# Patient Record
Sex: Male | Born: 1970 | Race: White | Hispanic: No | Marital: Married | Smoking: Never smoker
Health system: Southern US, Community
[De-identification: ages and names within clinical notes are randomized; demographics above are authoritative.]

## PROBLEM LIST (undated history)

## (undated) DIAGNOSIS — I10 Essential (primary) hypertension: Secondary | ICD-10-CM

## (undated) DIAGNOSIS — R51 Headache: Secondary | ICD-10-CM

## (undated) DIAGNOSIS — M1990 Unspecified osteoarthritis, unspecified site: Secondary | ICD-10-CM

## (undated) DIAGNOSIS — R519 Headache, unspecified: Secondary | ICD-10-CM

## (undated) DIAGNOSIS — K219 Gastro-esophageal reflux disease without esophagitis: Secondary | ICD-10-CM

## (undated) DIAGNOSIS — S199XXA Unspecified injury of neck, initial encounter: Secondary | ICD-10-CM

## (undated) DIAGNOSIS — J40 Bronchitis, not specified as acute or chronic: Secondary | ICD-10-CM

## (undated) HISTORY — PX: CERVICAL SPINE SURGERY: SHX589

## (undated) HISTORY — PX: OTHER SURGICAL HISTORY: SHX169

## (undated) HISTORY — PX: WISDOM TOOTH EXTRACTION: SHX21

## (undated) HISTORY — PX: ELBOW SURGERY: SHX618

---

## 2007-03-23 ENCOUNTER — Ambulatory Visit: Payer: Self-pay | Admitting: Orthopedic Surgery

## 2007-04-29 ENCOUNTER — Inpatient Hospital Stay (HOSPITAL_COMMUNITY): Admission: RE | Admit: 2007-04-29 | Discharge: 2007-04-30 | Payer: Self-pay | Admitting: Neurosurgery

## 2007-12-19 ENCOUNTER — Encounter: Admission: RE | Admit: 2007-12-19 | Discharge: 2007-12-19 | Payer: Self-pay | Admitting: Neurosurgery

## 2008-02-24 ENCOUNTER — Ambulatory Visit (HOSPITAL_COMMUNITY): Admission: RE | Admit: 2008-02-24 | Discharge: 2008-02-25 | Payer: Self-pay | Admitting: Neurosurgery

## 2008-03-03 ENCOUNTER — Emergency Department (HOSPITAL_COMMUNITY): Admission: EM | Admit: 2008-03-03 | Discharge: 2008-03-03 | Payer: Self-pay | Admitting: Emergency Medicine

## 2008-05-11 ENCOUNTER — Ambulatory Visit: Payer: Self-pay | Admitting: Neurosurgery

## 2008-08-29 ENCOUNTER — Ambulatory Visit: Payer: Self-pay | Admitting: Neurosurgery

## 2008-12-27 ENCOUNTER — Ambulatory Visit: Payer: Self-pay | Admitting: Neurosurgery

## 2009-01-03 ENCOUNTER — Ambulatory Visit (HOSPITAL_COMMUNITY): Admission: RE | Admit: 2009-01-03 | Discharge: 2009-01-03 | Payer: Self-pay | Admitting: Neurosurgery

## 2009-08-04 ENCOUNTER — Ambulatory Visit: Payer: Self-pay | Admitting: Family Medicine

## 2009-12-28 ENCOUNTER — Ambulatory Visit: Payer: Self-pay | Admitting: Physician Assistant

## 2010-08-17 ENCOUNTER — Ambulatory Visit: Payer: Self-pay

## 2011-01-01 NOTE — Op Note (Signed)
NAME:  Javier Griffin, Javier Griffin               ACCOUNT NO.:  [id number]   MEDICAL RECORD NO.:  [id number]          PATIENT TYPE:  INP   LOCATION:  3035                         FACILITY:  [facility]   PHYSICIAN:  [name], M.D.DATE OF BIRTH:  1971-05-09   DATE OF PROCEDURE:  DATE OF DISCHARGE:                               OPERATIVE REPORT   BRIEF HISTORY:  The patient is a 40 year old white male who suffered  from neck and right arm pain.  He failed medical management and worked  up with a cervical MRI, which demonstrated he had a large herniated disc  at C5-C6 with significant spondylosis at C6-C7.  I discussed various  treatments options with the patient including surgery.  The patient is  aware of the risks, benefits, and alternatives of the surgery and have  decided to proceed with C5-C6 and C6-C7 anterior cervical discectomy and  fusion with plate.   PREOPERATIVE DIAGNOSES:  C5-C6 herniated nucleus pulposus, C6-C7  spondylosis, cervical stenosis, cervical radiculopathy, and cervicalgia.   POSTOPERATIVE DIAGNOSES:  C5-C6 herniated nucleus pulposus, C6-C7  spondylosis, cervical stenosis, cervical radiculopathy, and cervicalgia.   PROCEDURE:  C5-C6 and C6-C7 extensive anterior cervical  discectomy/decompression.  Insertion of a C5-C6 and C6-C7 interbody  prosthesis (Alphatec PEEK interbody prosthesis); C5-6 and C6-C7 anterior  interbody arthrodesis with local morselized autograft bone; C5-C7  anterior cervical plating with Codman Slim-Loc titanium plate and  screws.   SURGEON:  [name], M.D.   ASSISTANT:  Danae Orleans. Venetia Maxon, M.D.   ANESTHESIA:  General endotracheal.   ESTIMATED BLOOD LOSS:  100 mL.   SPECIMENS:  None.   DRAINS:  None.   COMPLICATIONS:  None.   PROCEDURE:  The patient was brought to the operating room by anesthesia  team.  General endotracheal anesthesia was induced.  The patient  remained in supine position.  A roll was placed under shoulders to  place  his neck in a slight extension.  His anterior cervical region was then  prepared with Betadine scrub and Betadine solution.  Sterile drapes were  applied.  I then injected the area to incise with Marcaine with  epinephrine solution.  I used a scalpel to make a transverse incision in  the patient's left anterior neck.  I used the Metzenbaum scissors to  divide the platysma muscle and then to dissect medial to  sternocleidomastoid muscle, jugular vein, and carotid artery.  I  carefully dissected down towards the anterior cervical spine and  identified the esophagus retracting it medially.  I then used kitner  swabs to clear soft tissue from the anterior cervical spine and inserted  a bent spinal needle into the upper exposed intervertebral disc space.  We obtained an intraoperative radiograph to confirm our location.   I then used electrocautery to detach the medial border of the longus  colli muscle bilaterally from C5-6 and C6-7 intervertebral disc space.  We then inserted the Caspar self-retaining retractor for exposure.  We  began at C6-C7, I incised the C6-7 intervertebral disc with a 15 blade  scalpel.  We performed a partial intervertebral discectomy with the  pituitary forceps and the Carlens curettes.  We then inserted  distraction screws at C6 and C7, then distracted interspace and then  used a high-speed drill to decorticate the vertebral endplates at C6-C7,  drill away the remainder of C6-C7 intervertebral disc, drill away some  posterior spondylosis, and then to thin out the posterior longitudinal  ligament.  We then incised the ligament with arachnoid knife and removed  it with Kerrison punch undercutting the vertebral endplates  decompressing the thecal sac.  We then performed a foraminotomy about  the bilateral C7 nerve root completing the decompression at this level.  At C6-C7, we found mainly spondylosis.   We then repeated this procedure in an analagous fashion  at C5-6  decompressing the thecal sac and bilateral C6 nerve root.  Of note, we  encountered a large right-sided herniated disc compressing the right C6  nerve root.   Having completed the decompression at C5-C6 and C6-C7, we now turned our  attention to arthrodesis.  We used trial spacers and determined to use a  5-mm Alphatec medium PEAK interbody prosthesis.  We prefilled this  prosthesis with a combination of local autograft bone we obtained during  the decompression, as well as detached bone graft extender.  We then  inserted the prosthesis to the distracted interspace and then removed  the distraction screws.  There was a good snug fit of the prosthesis at  both levels.   We now turned our attention to the anterior spinal instrumentation.  We  used a high-speed drill to drill away some ventral spondylosis from the  vertebral endplates at C5-6 and C6-C7, so that the plate would lay down  flat.  We selected appropriate length Codman Slim-Loc anterior cervical  plate and laid it along the anterior aspect of the vertebral bodies of  C5 to C7.  We then drilled two 14-mm holes at C5-C6 and C6-C7, and  secured the plate to the vertebral bodies by placing two 14-mm self-  tapping screws at C5-C6 and C6-C7.  We then obtained intraoperative  radiograph.  There was limited visualization of lower plate screws, but  it all looked good in vivo.  We therefore secured the screws and plate  by locking each cam completing the instrumentation.   We then obtained hemostasis using bipolar cautery.  We irrigated the  wound out with bacitracin solution.  We then removed the Caspar  retractor.  We then inspected the esophagus for any damage, there was  none apparent.  We then reapproximated the patient's platysma muscle  with interrupted 3-0 Vicryl sutures, subcutaneous tissue with  interrupted 3-0 Vicryl suture, and skin with Steri-Strips and Benzoin.  The wound was then coated with bacitracin  ointment.  A sterile dressing  applied.  The drapes were removed.  The patient was subsequently  extubated by the anesthesia team and transported to post anesthesia care  unit in stable condition.  All sponge, instrument, and needle counts  were correct at the end of this case.      [name], M.D.  Electronically Signed     JDJ/MEDQ  D:  04/29/2007  T:  04/30/2007  Job:  [id number]

## 2011-01-01 NOTE — Op Note (Signed)
NAME:  Marines, Leith               ACCOUNT NO.:  [id number]   MEDICAL RECORD NO.:  [id number]          PATIENT TYPE:  OIB   LOCATION:  3533                         FACILITY:  [facility]   PHYSICIAN:  [name], M.D.DATE OF BIRTH:  1970/08/28   DATE OF PROCEDURE:  02/24/2008  DATE OF DISCHARGE:                               OPERATIVE REPORT   BRIEF HISTORY:  The patient is a 40 year old white male for whom I  performed a C5-L6 and C6-C7 anterior cervical discectomy and fusion and  plating back in September 2008.  The patient then initially did well but  then developed neck and left shoulder/arm pain.  He failed medical  management and was worked up with a cervical MRI which demonstrated  herniated disk at C4-C5 on the left.  I discussed various treatment  options with him, and the patient decided to proceed with a C4-C5  diskectomy, arthroplasty, and exploration of his fusion at C5-C6 and C6-  C7.   PREOPERATIVE DIAGNOSES:  C4-C5 herniated nucleus pulposus, spinal  stenosis, cervical radiculopathy/myelopathy, cervicalgia.   POSTOPERATIVE DIAGNOSES:  C4-C5 herniated nucleus pulposus, spinal  stenosis, cervical radiculopathy/myelopathy, cervicalgia.   PROCEDURES:  1. C4-C5 extensive anterior cervical diskectomy/decompression.  2. C4-C5 arthroplasty with Synthes ProDisc.  3. Exploration of C5-C7 cervical fusion/instrumentation.  4. C5-C7 anterior instrumentation with Codman Slim-Loc titanium plate      and screws.   SURGEON:  [name], MD   ASSISTANT:  None.   ANESTHESIA:  General endotracheal.   ESTIMATED BLOOD LOSS:  100 mL.   SPECIMENS:  None.   DRAINS:  None.   COMPLICATIONS:  None.   DESCRIPTION OF PROCEDURE:  The patient was brought to the operating room  by the anesthesia team.  General endotracheal anesthesia was induced.  The patient remained in supine position, placed a blanket under the  shoulder, also placed his neck in slight extension.  His  anterior  cervical region was then prepared with Betadine scrub and Betadine  solution.  Sterile drapes were applied and then injected the area to be  incised with Marcaine with epinephrine solution.  I used a scalpel to  make a transverse incision in the patient's left anterior neck.  I used  Metzenbaum scissors to divide the platysma muscle and then to dissect  medial to sternocleidomastoid muscle, jugular vein, carotid artery,  carefully dissected through the previous scar tissue identifying the  esophagus and retracting it medially.  We used skin swabs to clear soft  tissue from the anterior cervical spine.  We encountered the typical  layer of scar tissue over old plate.  We incised the scar tissue over  the old plate and then carefully dissected free from the underlying  plate.  This exposed the underlying plate.  We then unlocked the cams,  removed the screws, and removed the old plate.  We then explored the  arthrodesis at C5-C6 at C6-C7.  We could not see any clear motion at  these levels, but the inferior screws seemed somewhat loose, and I  therefore thought it prudent to replace the plate at the end  of the  case, in case there was pseudoarthrosis,we need to give him a long time  to heal.   We then used electrocautery to detach more medial border of the longus  colli muscle bilaterally from C4-C5 intervertebral disk space.  We  inserted a Caspar self-retaining retractor underneath the longus colli  muscle bilaterally to provide exposure.  We then incised the C4-C5  intervertebral disk with a 15 blade scalpel and performed a partial  intervertebral dissection with pituitary forceps.  We then inserted the  Synthes distraction pins under fluoroscopic guidance, distracted the  interspace of C4-C5.  We then brought the operative microscope into the  field and under magnification and illumination, we completed the  microdissection/decompression.  We used the high-speed drill to   decorticate the posterior vertebral endplates at C4-C5, drill away the  uncovertebral joints and to thin out the posterior longitudinal  ligament.  We incised the ligament with the arachnoid knife and then  removed it with Kerrison punch undercutting the vertebral endplates  decompressing the thecal sac.  We then performed foraminotomies about  the bilateral C5 nerve roots completing the decompression.   Now, we turned our attention to the arthroplasty.  We used trial spacers  and determined to use a 6-mm medium prosthesis.  We then used the drill  to create the troughs.  We then removed the drill guide and used a  chisel to deepen the troughs.  This was all done under fluoroscopic  guidance.  We then inserted the medium 6-mm Synthes ProDisc into the  interspace under fluoroscopic guidance.  We tapped it into place  appropriately.  We then removed the distraction screws.  We then put  bone wax over the anterior keels and over the distraction pin insertion  sites.  We obtained AP and lateral fluoroscopy images which demonstrated  good position of the prosthesis.   We now turned our attention to instrumentation of C5-C7.  Because the  lower screws were somewhat loose, I decided to put rescue screws in.  We  used the patient's old anterior plate and then secured it into place  using the old holes with a 14-mm rescue screws.  There was good purchase  of the screws at each level.  We then obtained a lateral x-ray which  demonstrated good position of the prosthesis and anterior plate and  screws.  We could not see the lower plate screws because of the  patient's shoulders, but they looked good in vivo.  We therefore secured  the screws to the plate by locking each cam.  This completed the  instrumentation.   We then obtained hemostasis using bipolar electrocautery.  We irrigated  the wound out with bacitracin solution.  We removed the retractor.  We  then inspected the esophagus for any  damage, there was none apparent.  We then reapproximated the patient's platysma muscle with interrupted 3-  0 Vicryl suture, subcutaneous tissue with interrupted 3-0 Vicryl suture,  and the skin with Steri-Strips and Benzoin.  The wound was then coated  with bacitracin ointment.  Sterile dressing was applied.  The drapes  were removed, and the patient was subsequently extubated by the  Anesthesia Team and transported to the Postanesthesia Care Unit in  stable condition.  All sponge, instrument, and needle counts were  correct at the end of this case.      [name], M.D.  Electronically Signed     JDJ/MEDQ  D:  02/24/2008  T:  02/25/2008  Job:  [id number]

## 2011-05-16 LAB — CBC
HCT: 43.6
Hemoglobin: 15.3
MCHC: 35.1
MCV: 88.6
RDW: 12.1

## 2011-05-31 LAB — CBC
HCT: 41.4
Platelets: 233
RDW: 11.9

## 2011-05-31 LAB — BASIC METABOLIC PANEL
BUN: 13
Calcium: 10
Creatinine, Ser: 0.71
GFR calc non Af Amer: >60
Glucose, Bld: 93

## 2012-04-06 ENCOUNTER — Ambulatory Visit: Payer: Self-pay | Admitting: Family Medicine

## 2012-04-08 ENCOUNTER — Ambulatory Visit: Payer: Self-pay | Admitting: Family Medicine

## 2012-12-24 ENCOUNTER — Ambulatory Visit: Payer: Self-pay | Admitting: Family Medicine

## 2012-12-28 ENCOUNTER — Ambulatory Visit: Payer: Self-pay | Admitting: Family Medicine

## 2012-12-30 ENCOUNTER — Ambulatory Visit: Payer: Self-pay | Admitting: Family Medicine

## 2013-03-19 ENCOUNTER — Other Ambulatory Visit: Payer: Self-pay | Admitting: Neurosurgery

## 2013-03-19 DIAGNOSIS — M542 Cervicalgia: Secondary | ICD-10-CM

## 2013-03-22 ENCOUNTER — Ambulatory Visit
Admission: RE | Admit: 2013-03-22 | Discharge: 2013-03-22 | Disposition: A | Discharge status: Home / Self Care | Payer: Self-pay | Source: Ambulatory Visit | Attending: Neurosurgery | Admitting: Neurosurgery

## 2013-03-22 VITALS — BP 145/94 | HR 64

## 2013-03-22 DIAGNOSIS — M542 Cervicalgia: Secondary | ICD-10-CM

## 2013-03-22 MED ORDER — HYDROMORPHONE HCL PF 2 MG/ML IJ SOLN
2.0000 mg | Freq: Once | INTRAMUSCULAR | Status: AC
  Administered 2013-03-22: 2 mg via INTRAMUSCULAR

## 2013-03-22 MED ORDER — DIAZEPAM 5 MG PO TABS
10.0000 mg | ORAL_TABLET | Freq: Once | ORAL | Status: AC
  Administered 2013-03-22: 10 mg via ORAL

## 2013-03-22 MED ORDER — ONDANSETRON HCL 4 MG/2ML IJ SOLN
4.0000 mg | Freq: Once | INTRAMUSCULAR | Status: AC
  Administered 2013-03-22: 4 mg via INTRAMUSCULAR

## 2013-03-22 MED ORDER — HYDROMORPHONE HCL 4 MG PO TABS
4.0000 mg | ORAL_TABLET | Freq: Once | ORAL | Status: AC
  Administered 2013-03-22: 4 mg via ORAL

## 2013-03-22 MED ORDER — HYDROMORPHONE HCL PF 1 MG/ML IJ SOLN
1.0000 mg | Freq: Once | INTRAMUSCULAR | Status: AC
  Administered 2013-03-22: 1 mg via INTRAMUSCULAR

## 2013-03-22 MED ORDER — IOHEXOL 300 MG/ML  SOLN
10.0000 mL | Freq: Once | INTRAMUSCULAR | Status: AC | PRN
  Administered 2013-03-22: 10 mL via INTRATHECAL

## 2013-04-12 ENCOUNTER — Other Ambulatory Visit: Payer: Self-pay | Admitting: Neurosurgery

## 2013-04-13 ENCOUNTER — Encounter (HOSPITAL_COMMUNITY): Payer: Self-pay | Admitting: Pharmacy Technician

## 2013-04-17 NOTE — Pre-Procedure Instructions (Signed)
Javier Griffin  04/17/2013   Your procedure is scheduled on:  September 4  Report to [facility] at 07:00 AM.  Call this number if you have problems the morning of surgery: [phone]   Remember:   Do not eat food or drink liquids after midnight.   Take these medicines the morning of surgery with A SIP OF WATER: Diazepam, Oxycodone (take these meds only if needed)   Do not take Aspirin, Aleve, Naproxen, Advil, Ibuprofen, Vitamin, Herbs, or Supplements starting today  Do not wear jewelry, make-up or nail polish.  Do not wear lotions, powders, or perfumes. You may wear deodorant.  Do not shave 48 hours prior to surgery. Men may shave face and neck.  Do not bring valuables to the hospital.  [facility] is not responsible                   for any belongings or valuables.  Contacts, dentures or bridgework may not be worn into surgery.  Leave suitcase in the car. After surgery it may be brought to your room.  For patients admitted to the hospital, checkout time is 11:00 AM the day of  discharge.   Special Instructions: Shower using CHG 2 nights before surgery and the night before surgery.  If you shower the day of surgery use CHG.  Use special wash - you have one bottle of CHG for all showers.  You should use approximately 1/3 of the bottle for each shower.   Please read over the following fact sheets that you were given: Pain Booklet, Coughing and Deep Breathing, Blood Transfusion Information and Surgical Site Infection Prevention

## 2013-04-20 ENCOUNTER — Encounter (HOSPITAL_COMMUNITY)
Admission: RE | Admit: 2013-04-20 | Discharge: 2013-04-20 | Disposition: A | Discharge status: Home / Self Care | Payer: Worker's Compensation | Source: Ambulatory Visit | Attending: Neurosurgery | Admitting: Neurosurgery

## 2013-04-20 ENCOUNTER — Encounter (HOSPITAL_COMMUNITY): Payer: Self-pay

## 2013-04-20 LAB — BASIC METABOLIC PANEL
BUN: 15 mg/dL (ref 6–23)
CO2: 27 mEq/L (ref 19–32)
Glucose, Bld: 93 mg/dL (ref 70–99)
Potassium: 4 mEq/L (ref 3.5–5.1)
Sodium: 135 mEq/L (ref 135–145)

## 2013-04-20 LAB — SURGICAL PCR SCREEN
MRSA, PCR: NEGATIVE
Staphylococcus aureus: POSITIVE — AB

## 2013-04-20 LAB — CBC
HCT: 40.1 % (ref 39.0–52.0)
Hemoglobin: 14.3 g/dL (ref 13.0–17.0)
MCH: 30.6 pg (ref 26.0–34.0)
MCHC: 35.7 g/dL (ref 30.0–36.0)
RBC: 4.67 MIL/uL (ref 4.22–5.81)

## 2013-04-20 LAB — TYPE AND SCREEN: Antibody Screen: NEGATIVE

## 2013-04-20 NOTE — Progress Notes (Signed)
Javier Griffin notified to call in prescription for mupirocin to [facility] on [address]

## 2013-04-20 NOTE — Progress Notes (Signed)
Primary physician - Isabel Caprice - [facility] Does not have a cardiology  No prior cardiac testing

## 2013-04-21 MED ORDER — CEFAZOLIN SODIUM-DEXTROSE 2-3 GM-% IV SOLR
2.0000 g | INTRAVENOUS | Status: AC
  Administered 2013-04-22: 2 g via INTRAVENOUS
  Filled 2013-04-21: qty 50

## 2013-04-22 ENCOUNTER — Encounter (HOSPITAL_COMMUNITY)
Admission: RE | Disposition: A | Discharge status: Home / Self Care | Payer: Self-pay | Source: Ambulatory Visit | Attending: Neurosurgery

## 2013-04-22 ENCOUNTER — Encounter (HOSPITAL_COMMUNITY): Payer: Self-pay | Admitting: Critical Care Medicine

## 2013-04-22 ENCOUNTER — Ambulatory Visit (HOSPITAL_COMMUNITY)
Admission: RE | Admit: 2013-04-22 | Discharge: 2013-04-23 | Disposition: A | Discharge status: Home / Self Care | Payer: Worker's Compensation | Source: Ambulatory Visit | Attending: Neurosurgery | Admitting: Neurosurgery

## 2013-04-22 ENCOUNTER — Encounter (HOSPITAL_COMMUNITY): Payer: Self-pay | Admitting: Surgery

## 2013-04-22 ENCOUNTER — Ambulatory Visit (HOSPITAL_COMMUNITY): Payer: Worker's Compensation

## 2013-04-22 ENCOUNTER — Ambulatory Visit (HOSPITAL_COMMUNITY): Payer: Worker's Compensation | Admitting: Critical Care Medicine

## 2013-04-22 DIAGNOSIS — M5090 Cervical disc disorder, unspecified, unspecified cervical region: Secondary | ICD-10-CM | POA: Insufficient documentation

## 2013-04-22 DIAGNOSIS — M5000 Cervical disc disorder with myelopathy, unspecified cervical region: Secondary | ICD-10-CM

## 2013-04-22 DIAGNOSIS — M5020 Other cervical disc displacement, unspecified cervical region: Secondary | ICD-10-CM | POA: Insufficient documentation

## 2013-04-22 HISTORY — PX: ANTERIOR CERVICAL DECOMP/DISCECTOMY FUSION: SHX1161

## 2013-04-22 SURGERY — ANTERIOR CERVICAL DECOMPRESSION/DISCECTOMY FUSION 1 LEVEL/HARDWARE REMOVAL
Anesthesia: General | Site: Spine Cervical | Wound class: Clean

## 2013-04-22 MED ORDER — OXYCODONE-ACETAMINOPHEN 5-325 MG PO TABS
1.0000 | ORAL_TABLET | ORAL | Status: DC | PRN
  Filled 2013-04-22: qty 2

## 2013-04-22 MED ORDER — HEMOSTATIC AGENTS (NO CHARGE) OPTIME
TOPICAL | Status: DC | PRN
  Administered 2013-04-22: 1 via TOPICAL

## 2013-04-22 MED ORDER — PANTOPRAZOLE SODIUM 40 MG IV SOLR
40.0000 mg | Freq: Every day | INTRAVENOUS | Status: DC
  Administered 2013-04-22: 40 mg via INTRAVENOUS
  Filled 2013-04-22 (×2): qty 40

## 2013-04-22 MED ORDER — 0.9 % SODIUM CHLORIDE (POUR BTL) OPTIME
TOPICAL | Status: DC | PRN
  Administered 2013-04-22: 1000 mL

## 2013-04-22 MED ORDER — PHENYLEPHRINE HCL 10 MG/ML IJ SOLN
INTRAMUSCULAR | Status: DC | PRN
  Administered 2013-04-22: 40 ug via INTRAVENOUS
  Administered 2013-04-22: 80 ug via INTRAVENOUS

## 2013-04-22 MED ORDER — THROMBIN 5000 UNITS EX SOLR
CUTANEOUS | Status: DC | PRN
  Administered 2013-04-22 (×2): 5000 [IU] via TOPICAL

## 2013-04-22 MED ORDER — OXYCODONE HCL 5 MG/5ML PO SOLN: 5.0000 mg | Freq: Once | ORAL | Status: DC | PRN

## 2013-04-22 MED ORDER — LIDOCAINE HCL (CARDIAC) 20 MG/ML IV SOLN
INTRAVENOUS | Status: DC | PRN
  Administered 2013-04-22: 100 mg via INTRAVENOUS

## 2013-04-22 MED ORDER — DOCUSATE SODIUM 100 MG PO CAPS
100.0000 mg | ORAL_CAPSULE | Freq: Two times a day (BID) | ORAL | Status: DC
  Administered 2013-04-22 – 2013-04-23 (×3): 100 mg via ORAL
  Filled 2013-04-22 (×3): qty 1

## 2013-04-22 MED ORDER — ARTIFICIAL TEARS OP OINT
TOPICAL_OINTMENT | OPHTHALMIC | Status: DC | PRN
  Administered 2013-04-22: 1 via OPHTHALMIC

## 2013-04-22 MED ORDER — PHENOL 1.4 % MT LIQD: 1.0000 | OROMUCOSAL | Status: DC | PRN

## 2013-04-22 MED ORDER — DIAZEPAM 5 MG PO TABS
5.0000 mg | ORAL_TABLET | Freq: Four times a day (QID) | ORAL | Status: DC | PRN
  Administered 2013-04-22 – 2013-04-23 (×2): 5 mg via ORAL
  Filled 2013-04-22 (×3): qty 1

## 2013-04-22 MED ORDER — MEPERIDINE HCL 25 MG/ML IJ SOLN: 6.2500 mg | INTRAMUSCULAR | Status: DC | PRN

## 2013-04-22 MED ORDER — HYDROMORPHONE HCL PF 1 MG/ML IJ SOLN
INTRAMUSCULAR | Status: AC
  Filled 2013-04-22: qty 1

## 2013-04-22 MED ORDER — MUPIROCIN 2 % EX OINT: 1.0000 "application " | TOPICAL_OINTMENT | Freq: Two times a day (BID) | CUTANEOUS | Status: DC

## 2013-04-22 MED ORDER — PROPOFOL 10 MG/ML IV BOLUS
INTRAVENOUS | Status: DC | PRN
  Administered 2013-04-22: 40 mg via INTRAVENOUS
  Administered 2013-04-22: 100 mg via INTRAVENOUS

## 2013-04-22 MED ORDER — LACTATED RINGERS IV SOLN: INTRAVENOUS | Status: DC

## 2013-04-22 MED ORDER — LACTATED RINGERS IV SOLN
INTRAVENOUS | Status: DC
  Administered 2013-04-22 (×2): via INTRAVENOUS

## 2013-04-22 MED ORDER — HYDROCODONE-ACETAMINOPHEN 5-325 MG PO TABS: 1.0000 | ORAL_TABLET | ORAL | Status: DC | PRN

## 2013-04-22 MED ORDER — DEXAMETHASONE SODIUM PHOSPHATE 4 MG/ML IJ SOLN
4.0000 mg | Freq: Four times a day (QID) | INTRAMUSCULAR | Status: AC
  Administered 2013-04-22: 4 mg via INTRAVENOUS
  Filled 2013-04-22: qty 1

## 2013-04-22 MED ORDER — CEFAZOLIN SODIUM-DEXTROSE 2-3 GM-% IV SOLR
2.0000 g | Freq: Three times a day (TID) | INTRAVENOUS | Status: AC
Start: 2013-04-22 — End: 2013-04-23
  Administered 2013-04-22 (×2): 2 g via INTRAVENOUS
  Filled 2013-04-22 (×2): qty 50

## 2013-04-22 MED ORDER — BACITRACIN ZINC 500 UNIT/GM EX OINT
TOPICAL_OINTMENT | CUTANEOUS | Status: DC | PRN
  Administered 2013-04-22: 1 via TOPICAL

## 2013-04-22 MED ORDER — ZOLPIDEM TARTRATE 5 MG PO TABS: 5.0000 mg | ORAL_TABLET | Freq: Every evening | ORAL | Status: DC | PRN

## 2013-04-22 MED ORDER — ONDANSETRON HCL 4 MG/2ML IJ SOLN
INTRAMUSCULAR | Status: DC | PRN
  Administered 2013-04-22: 4 mg via INTRAVENOUS

## 2013-04-22 MED ORDER — ROCURONIUM BROMIDE 100 MG/10ML IV SOLN
INTRAVENOUS | Status: DC | PRN
  Administered 2013-04-22 (×2): 10 mg via INTRAVENOUS
  Administered 2013-04-22: 50 mg via INTRAVENOUS

## 2013-04-22 MED ORDER — ONDANSETRON HCL 4 MG/2ML IJ SOLN: 4.0000 mg | INTRAMUSCULAR | Status: DC | PRN

## 2013-04-22 MED ORDER — ONDANSETRON HCL 4 MG/2ML IJ SOLN: 4.0000 mg | Freq: Once | INTRAMUSCULAR | Status: DC | PRN

## 2013-04-22 MED ORDER — ALUM & MAG HYDROXIDE-SIMETH 200-200-20 MG/5ML PO SUSP: 30.0000 mL | Freq: Four times a day (QID) | ORAL | Status: DC | PRN

## 2013-04-22 MED ORDER — DEXAMETHASONE SODIUM PHOSPHATE 4 MG/ML IJ SOLN
INTRAMUSCULAR | Status: DC | PRN
  Administered 2013-04-22: 10 mg via INTRAVENOUS

## 2013-04-22 MED ORDER — MENTHOL 3 MG MT LOZG
1.0000 | LOZENGE | OROMUCOSAL | Status: DC | PRN
  Filled 2013-04-22: qty 9

## 2013-04-22 MED ORDER — MIDAZOLAM HCL 5 MG/5ML IJ SOLN
INTRAMUSCULAR | Status: DC | PRN
  Administered 2013-04-22: 2 mg via INTRAVENOUS

## 2013-04-22 MED ORDER — MUPIROCIN 2 % EX OINT
TOPICAL_OINTMENT | Freq: Two times a day (BID) | CUTANEOUS | Status: DC
  Administered 2013-04-22: 1 via NASAL
  Administered 2013-04-22 – 2013-04-23 (×2): via NASAL
  Filled 2013-04-22: qty 22

## 2013-04-22 MED ORDER — BUPIVACAINE-EPINEPHRINE 0.5% -1:200000 IJ SOLN
INTRAMUSCULAR | Status: DC | PRN
  Administered 2013-04-22: 10 mL

## 2013-04-22 MED ORDER — FENTANYL CITRATE 0.05 MG/ML IJ SOLN
50.0000 ug | Freq: Once | INTRAMUSCULAR | Status: AC
  Administered 2013-04-22: 50 ug via INTRAVENOUS

## 2013-04-22 MED ORDER — FENTANYL CITRATE 0.05 MG/ML IJ SOLN
INTRAMUSCULAR | Status: DC | PRN
  Administered 2013-04-22 (×2): 50 ug via INTRAVENOUS
  Administered 2013-04-22: 200 ug via INTRAVENOUS
  Administered 2013-04-22 (×2): 50 ug via INTRAVENOUS

## 2013-04-22 MED ORDER — OXYCODONE HCL 5 MG PO TABS
20.0000 mg | ORAL_TABLET | ORAL | Status: DC | PRN
  Administered 2013-04-22 – 2013-04-23 (×4): 20 mg via ORAL
  Filled 2013-04-22 (×4): qty 4

## 2013-04-22 MED ORDER — MORPHINE SULFATE 2 MG/ML IJ SOLN
1.0000 mg | INTRAMUSCULAR | Status: DC | PRN
  Administered 2013-04-22: 4 mg via INTRAVENOUS
  Filled 2013-04-22: qty 2

## 2013-04-22 MED ORDER — FENTANYL CITRATE 0.05 MG/ML IJ SOLN
INTRAMUSCULAR | Status: AC
  Filled 2013-04-22: qty 2

## 2013-04-22 MED ORDER — NEOSTIGMINE METHYLSULFATE 1 MG/ML IJ SOLN
INTRAMUSCULAR | Status: DC | PRN
  Administered 2013-04-22: 3 mg via INTRAVENOUS

## 2013-04-22 MED ORDER — MUPIROCIN 2 % EX OINT
TOPICAL_OINTMENT | CUTANEOUS | Status: AC
  Administered 2013-04-22: 1 via NASAL
  Filled 2013-04-22: qty 22

## 2013-04-22 MED ORDER — HYDROMORPHONE HCL PF 1 MG/ML IJ SOLN
0.2500 mg | INTRAMUSCULAR | Status: DC | PRN
  Administered 2013-04-22 (×2): 0.5 mg via INTRAVENOUS

## 2013-04-22 MED ORDER — ACETAMINOPHEN 650 MG RE SUPP: 650.0000 mg | RECTAL | Status: DC | PRN

## 2013-04-22 MED ORDER — SODIUM CHLORIDE 0.9 % IR SOLN
Status: DC | PRN
  Administered 2013-04-22: 10:00:00

## 2013-04-22 MED ORDER — OXYCODONE HCL 5 MG PO TABS: 5.0000 mg | ORAL_TABLET | Freq: Once | ORAL | Status: DC | PRN

## 2013-04-22 MED ORDER — DEXAMETHASONE 4 MG PO TABS
4.0000 mg | ORAL_TABLET | Freq: Four times a day (QID) | ORAL | Status: AC
  Administered 2013-04-22 – 2013-04-23 (×2): 4 mg via ORAL
  Filled 2013-04-22 (×2): qty 1

## 2013-04-22 MED ORDER — GLYCOPYRROLATE 0.2 MG/ML IJ SOLN
INTRAMUSCULAR | Status: DC | PRN
  Administered 2013-04-22: 0.4 mg via INTRAVENOUS

## 2013-04-22 MED ORDER — ACETAMINOPHEN 325 MG PO TABS: 650.0000 mg | ORAL_TABLET | ORAL | Status: DC | PRN

## 2013-04-22 SURGICAL SUPPLY — 66 items
BAG DECANTER FOR FLEXI CONT (MISCELLANEOUS) ×2 IMPLANT
BENZOIN TINCTURE PRP APPL 2/3 (GAUZE/BANDAGES/DRESSINGS) ×2 IMPLANT
BIT DRILL NEURO 2X3.1 SFT TUCH (MISCELLANEOUS) ×1 IMPLANT
BLADE SURG 15 STRL LF DISP TIS (BLADE) ×1 IMPLANT
BLADE SURG 15 STRL SS (BLADE) ×1
BLADE ULTRA TIP 2M (BLADE) ×2 IMPLANT
BRUSH SCRUB EZ PLAIN DRY (MISCELLANEOUS) ×2 IMPLANT
BUR BARREL STRAIGHT FLUTE 4.0 (BURR) ×2 IMPLANT
BUR MATCHSTICK NEURO 3.0 LAGG (BURR) ×2 IMPLANT
CANISTER SUCTION 2500CC (MISCELLANEOUS) ×2 IMPLANT
CLOTH BEACON ORANGE TIMEOUT ST (SAFETY) ×2 IMPLANT
CONT SPEC 4OZ CLIKSEAL STRL BL (MISCELLANEOUS) ×4 IMPLANT
COVER MAYO STAND STRL (DRAPES) ×2 IMPLANT
DRAPE LAPAROTOMY 100X72 PEDS (DRAPES) ×2 IMPLANT
DRAPE MICROSCOPE LEICA (MISCELLANEOUS) IMPLANT
DRAPE POUCH INSTRU U-SHP 10X18 (DRAPES) ×2 IMPLANT
DRAPE SURG 17X23 STRL (DRAPES) ×4 IMPLANT
DRILL NEURO 2X3.1 SOFT TOUCH (MISCELLANEOUS) ×2
ELECT BLADE 4.0 EZ CLEAN MEGAD (MISCELLANEOUS) ×2
ELECT REM PT RETURN 9FT ADLT (ELECTROSURGICAL) ×2
ELECTRODE BLDE 4.0 EZ CLN MEGD (MISCELLANEOUS) ×1 IMPLANT
ELECTRODE REM PT RTRN 9FT ADLT (ELECTROSURGICAL) ×1 IMPLANT
GAUZE SPONGE 4X4 16PLY XRAY LF (GAUZE/BANDAGES/DRESSINGS) IMPLANT
GLOVE BIO SURGEON STRL SZ8 (GLOVE) ×2 IMPLANT
GLOVE BIO SURGEON STRL SZ8.5 (GLOVE) ×2 IMPLANT
GLOVE BIOGEL M SZ8.5 STRL (GLOVE) ×2 IMPLANT
GLOVE BIOGEL PI IND STRL 8.5 (GLOVE) ×4 IMPLANT
GLOVE BIOGEL PI INDICATOR 8.5 (GLOVE) ×4
GLOVE EXAM NITRILE LRG STRL (GLOVE) IMPLANT
GLOVE EXAM NITRILE MD LF STRL (GLOVE) ×2 IMPLANT
GLOVE EXAM NITRILE XL STR (GLOVE) IMPLANT
GLOVE EXAM NITRILE XS STR PU (GLOVE) IMPLANT
GLOVE OPTIFIT SS 8.0 STRL (GLOVE) ×2 IMPLANT
GLOVE SS BIOGEL STRL SZ 8 (GLOVE) ×1 IMPLANT
GLOVE SUPERSENSE BIOGEL SZ 8 (GLOVE) ×1
GLOVE SURG SS PI 8.0 STRL IVOR (GLOVE) ×6 IMPLANT
GOWN BRE IMP SLV AUR LG STRL (GOWN DISPOSABLE) IMPLANT
GOWN BRE IMP SLV AUR XL STRL (GOWN DISPOSABLE) ×4 IMPLANT
GOWN STRL REIN 2XL LVL4 (GOWN DISPOSABLE) ×2 IMPLANT
KIT BASIN OR (CUSTOM PROCEDURE TRAY) ×2 IMPLANT
KIT ROOM TURNOVER OR (KITS) ×2 IMPLANT
MARKER SKIN DUAL TIP RULER LAB (MISCELLANEOUS) ×2 IMPLANT
NEEDLE HYPO 22GX1.5 SAFETY (NEEDLE) ×2 IMPLANT
NEEDLE SPNL 18GX3.5 QUINCKE PK (NEEDLE) ×2 IMPLANT
NS IRRIG 1000ML POUR BTL (IV SOLUTION) ×2 IMPLANT
PACK LAMINECTOMY NEURO (CUSTOM PROCEDURE TRAY) ×2 IMPLANT
PIN DISTRACTION 14MM (PIN) ×4 IMPLANT
PLATE ONE LEVEL SKYLINE 16MM (Plate) ×2 IMPLANT
PUTTY ABX ACTIFUSE 1.5ML (Putty) ×2 IMPLANT
RUBBERBAND STERILE (MISCELLANEOUS) IMPLANT
SCREW SKYLINE VAR OS 14MM (Screw) ×4 IMPLANT
SCREW VAR SELF TAP SKYLINE 14M (Screw) ×4 IMPLANT
SPONGE GAUZE 4X4 12PLY (GAUZE/BANDAGES/DRESSINGS) ×2 IMPLANT
SPONGE INTESTINAL PEANUT (DISPOSABLE) ×4 IMPLANT
SPONGE SURGIFOAM ABS GEL SZ50 (HEMOSTASIS) ×2 IMPLANT
STRIP CLOSURE SKIN 1/2X4 (GAUZE/BANDAGES/DRESSINGS) ×2 IMPLANT
SUT VIC AB 0 CT1 27 (SUTURE) ×1
SUT VIC AB 0 CT1 27XBRD ANTBC (SUTURE) ×1 IMPLANT
SUT VIC AB 3-0 SH 8-18 (SUTURE) ×2 IMPLANT
SYR 20ML ECCENTRIC (SYRINGE) ×2 IMPLANT
TAPE CLOTH SURG 4X10 WHT LF (GAUZE/BANDAGES/DRESSINGS) ×2 IMPLANT
TAPE STRIPS DRAPE STRL (GAUZE/BANDAGES/DRESSINGS) ×2 IMPLANT
TOWEL OR 17X24 6PK STRL BLUE (TOWEL DISPOSABLE) ×2 IMPLANT
TOWEL OR 17X26 10 PK STRL BLUE (TOWEL DISPOSABLE) ×2 IMPLANT
VISTA S 14X14X7 (Spacer) ×2 IMPLANT
WATER STERILE IRR 1000ML POUR (IV SOLUTION) ×2 IMPLANT

## 2013-04-22 NOTE — Anesthesia Preprocedure Evaluation (Addendum)
Anesthesia Evaluation  Patient identified by MRN, date of birth, ID band Patient awake    Reviewed: Allergy & Precautions, H&P , NPO status , Patient's Chart, lab work & pertinent test results  Airway Mallampati: I TM Distance: >3 FB Neck ROM: Full    Dental  (+) Dental Advisory Given   Pulmonary former smoker,          Cardiovascular     Neuro/Psych PSYCHIATRIC DISORDERS Anxiety    GI/Hepatic   Endo/Other    Renal/GU      Musculoskeletal   Abdominal   Peds  Hematology   Anesthesia Other Findings   Reproductive/Obstetrics                          Anesthesia Physical Anesthesia Plan  ASA: II  Anesthesia Plan: General   Post-op Pain Management:    Induction: Intravenous  Airway Management Planned: Oral ETT  Additional Equipment:   Intra-op Plan:   Post-operative Plan: Extubation in OR  Informed Consent: I have reviewed the patients History and Physical, chart, labs and discussed the procedure including the risks, benefits and alternatives for the proposed anesthesia with the patient or authorized representative who has indicated his/her understanding and acceptance.   Dental advisory given  Plan Discussed with: Surgeon and CRNA  Anesthesia Plan Comments:        Anesthesia Quick Evaluation
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
## 2013-04-22 NOTE — Progress Notes (Signed)
Patient ID: Javier Griffin, male   DOB: 12-23-1970, 42 y.o.   MRN: [id number] Subjective:  The patient is somnolent but arousable. He is in no apparent distress.  Objective: Vital signs in last 24 hours: Temp:  [98 F (36.7 C)-98.2 F (36.8 C)] 98 F (36.7 C) (09/04 1207) Pulse Rate:  [68-102] 102 (09/04 1245) Resp:  [10-20] 12 (09/04 1245) BP: (102-157)/(52-99) 110/78 mmHg (09/04 1241) SpO2:  [92 %-97 %] 94 % (09/04 1245)  Intake/Output from previous day:   Intake/Output this shift: Total I/O In: 1500 [I.V.:1500] Out: 50 [Blood:50]  Physical exam the patient is somnolent but arousable. He is moving all 4 extremities well. His dressing is clean and dry. There is no evidence of hematoma or shift.  Lab Results:  Recent Labs  04/20/13 1319  WBC 7.1  HGB 14.3  HCT 40.1  PLT 217   BMET  Recent Labs  04/20/13 1319  NA 135  K 4.0  CL 97  CO2 27  GLUCOSE 93  BUN 15  CREATININE 0.90  CALCIUM 9.5    Studies/Results: No results found.  Assessment/Plan: The patient is doing well.  LOS: 0 days     Tyliah Schlereth D 04/22/2013, 12:52 PM

## 2013-04-22 NOTE — Preoperative (Signed)
Beta Blockers   Reason not to administer Beta Blockers:Not Applicable 

## 2013-04-22 NOTE — Transfer of Care (Signed)
Immediate Anesthesia Transfer of Care Note  Patient: Javier Griffin  Procedure(s) Performed: Procedure(s) with comments: ANTERIOR CERVICAL DECOMPRESSION/DISCECTOMY FUSION 1 CERVICAL SEVEN-THORACIC ONELEVEL/HARDWARE REMOVAL (N/A) - C7/T1 anterior cervical decompression with fusion interbody prothesis plating and bonegraft with removal of codman slim loc plate  Patient Location: PACU  Anesthesia Type:General  Level of Consciousness: sedated  Airway & Oxygen Therapy: Patient Spontanous Breathing and Patient connected to face mask oxygen  Post-op Assessment: Report given to PACU RN, Post -op Vital signs reviewed and stable and Patient moving all extremities X 4  Post vital signs: Reviewed and stable  Complications: No apparent anesthesia complications

## 2013-04-22 NOTE — Anesthesia Postprocedure Evaluation (Signed)
Anesthesia Post Note  Patient: Javier Griffin  Procedure(s) Performed: Procedure(s) (LRB): ANTERIOR CERVICAL DECOMPRESSION/DISCECTOMY FUSION 1 CERVICAL SEVEN-THORACIC ONELEVEL/HARDWARE REMOVAL (N/A)  Anesthesia type: general  Patient location: PACU  Post pain: Pain level controlled  Post assessment: Patient's Cardiovascular Status Stable  Last Vitals:  Filed Vitals:   04/22/13 1431  BP: 135/84  Pulse: 112  Temp: 37.2 C  Resp: 16    Post vital signs: Reviewed and stable  Level of consciousness: sedated  Complications: No apparent anesthesia complications

## 2013-04-22 NOTE — H&P (Signed)
Subjective: The patient is a 42 year old white male who's had prior anterior cervicectomy fusion and plating. He has had some chronic neck pain. More recently his neck pain has worsened and he has pain radiating to his right arm. He was worked up with a cervical MRI and myelo CT. This demonstrated a herniated disc at C7-T1. I discussed the various treatment options with the patient including surgery. The patient has weighed the risks, benefits, and alternatives surgery and decided proceed with a C7-T1 intracervical discectomy, fusion and plating with exploration of his old fusion at C5-6 and C6-7 and removal of the old plate.   History reviewed. No pertinent past medical history.  Past Surgical History  Procedure Laterality Date  . Cervical spine surgery    . Carpel tunnel Left     and elbow     No Known Allergies  History  Substance Use Topics  . Smoking status: Former Games developer  . Smokeless tobacco: Not on file  . Alcohol Use: Yes     Comment: occasional    History reviewed. No pertinent family history. Prior to Admission medications   Medication Sig Start Date End Date Taking? Authorizing Provider  diazepam (VALIUM) 5 MG tablet Take 5 mg by mouth every 8 (eight) hours as needed for anxiety. For anxiety   Yes Historical Provider, MD  oxyCODONE (ROXICODONE) 15 MG immediate release tablet Take 15 mg by mouth every 4 (four) hours as needed for pain. For pain   Yes Historical Provider, MD     Review of Systems  Positive ROS: As above  All other systems have been reviewed and were otherwise negative with the exception of those mentioned in the HPI and as above.  Objective: Vital signs in last 24 hours: Temp:  [98.2 F (36.8 C)] 98.2 F (36.8 C) (09/04 0726) Pulse Rate:  [95] 95 (09/04 0726) Resp:  [20] 20 (09/04 0726) BP: (157)/(99) 157/99 mmHg (09/04 0726) SpO2:  [97 %] 97 % (09/04 0726)  General Appearance: Alert, cooperative, no distress, appears stated age Head:  Normocephalic, without obvious abnormality, atraumatic Eyes: PERRL, conjunctiva/corneas clear, EOM's intact, fundi benign, both eyes      Ears: Normal TM's and external ear canals, both ears Throat: Lips, mucosa, and tongue normal; teeth and gums normal Neck: Supple, symmetrical, trachea midline, no adenopathy; thyroid: No enlargement/tenderness/nodules; no carotid bruit or JVD Back: Symmetric, no curvature, ROM normal, no CVA tenderness Lungs: Clear to auscultation bilaterally, respirations unlabored Heart: Regular rate and rhythm, S1 and S2 normal, no murmur, rub or gallop Abdomen: Soft, non-tender, bowel sounds active all four quadrants, no masses, no organomegaly Extremities: Extremities normal, atraumatic, no cyanosis or edema Pulses: 2+ and symmetric all extremities Skin: Skin color, texture, turgor normal, no rashes or lesions  NEUROLOGIC:   Mental status: alert and oriented, no aphasia, good attention span, Fund of knowledge/ memory ok Motor Exam - grossly normal Sensory Exam - grossly normal Reflexes:  Coordination - grossly normal Gait - grossly normal Balance - grossly normal Cranial Nerves: I: smell Not tested  II: visual acuity  OS: Normal    OD:  normal  II: visual fields Full to confrontation  II: pupils Equal, round, reactive to light  III,VII: ptosis None  III,IV,VI: extraocular muscles  Full ROM  V: mastication Normal  V: facial light touch sensation  Normal  V,VII: corneal reflex  Present  VII: facial muscle function - upper  Normal  VII: facial muscle function - lower Normal  VIII: hearing Not tested  IX: soft palate elevation  Normal  IX,X: gag reflex Present  XI: trapezius strength  5/5  XI: sternocleidomastoid strength 5/5  XI: neck flexion strength  5/5  XII: tongue strength  Normal    Data Review Lab Results  Component Value Date   WBC 7.1 04/20/2013   HGB 14.3 04/20/2013   HCT 40.1 04/20/2013   MCV 85.9 04/20/2013   PLT 217 04/20/2013   Lab Results   Component Value Date   NA 135 04/20/2013   K 4.0 04/20/2013   CL 97 04/20/2013   CO2 27 04/20/2013   BUN 15 04/20/2013   CREATININE 0.90 04/20/2013   GLUCOSE 93 04/20/2013   No results found for this basename: INR, PROTIME    Assessment/Plan: C7-T1 herniated disc cervicalgia cervical radiculopathy: I discussed situation with the patient. I reviewed his imaging studies with him. I've pointed out the abnormalities. We have discussed the various treatment options including surgery. I described the surgical treatment option of exploration of cervical fusion with removal of his plate at W0-9 and C6-7 on the and an anterior cervical discectomy, fusion and plating at C7-T1. I've shown him surgical models. We have discussed the risks, benefits, alternatives, and likelihood of achieving our goals with surgery. I have answered all the patient's questions. He has decided to proceed with surgery.   Cullin Dishman D 04/22/2013 9:04 AM

## 2013-04-23 ENCOUNTER — Encounter (HOSPITAL_COMMUNITY): Payer: Self-pay | Admitting: Neurosurgery

## 2013-04-23 MED ORDER — OXYCODONE HCL 20 MG PO TABS
30.0000 mg | ORAL_TABLET | ORAL | Status: DC | PRN
Start: 2013-04-23 — End: 2013-09-01

## 2013-04-23 MED ORDER — DSS 100 MG PO CAPS: 100.0000 mg | ORAL_CAPSULE | Freq: Two times a day (BID) | ORAL | Status: DC

## 2013-04-23 MED ORDER — DIAZEPAM 5 MG PO TABS: 5.0000 mg | ORAL_TABLET | Freq: Four times a day (QID) | ORAL | Status: DC | PRN

## 2013-04-23 NOTE — Plan of Care (Signed)
Problem: Consults Goal: Diagnosis - Spinal Surgery Outcome: Completed/Met Date Met:  04/23/13 Cervical Spine Fusion     

## 2013-04-23 NOTE — Progress Notes (Signed)
Pt and wife given D/C instructions with Rx's, verbal understanding was given. All questions were answered prior to D/C. Pt D/C'd home with wife @ 1100 per MD order. Rema Fendt, RN

## 2013-04-23 NOTE — Discharge Summary (Signed)
Physician Discharge Summary  Patient ID: Javier Griffin MRN: [id number] DOB/AGE: [birth year] 41 y.o.  Admit date: 04/22/2013 Discharge date: 04/23/2013  Admission Diagnoses: C7-T1 herniated disc, cervicalgia, cervical radiculopathy  Discharge Diagnoses: The same Active Problems:   * No active hospital problems. *   Discharged Condition: good  Hospital Course: I performed an exploration of the patient's cervical fusion with a removal intracervical plate and N8-G9 anterior cervical discectomy, fusion and plating on 04/22/2013. The surgery went well.  [name] postoperative course was unremarkable. He requested discharge to home on postop day #1. The patient was given oral and written discharge instructions. All his, and his family's, questions were answered.  Consults: None Significant Diagnostic Studies: None Treatments: Exploration of cervical fusion, removal of cervical plate, F6-O1 intracervical discectomy, fusion, and plating. Discharge Exam: Blood pressure 105/61, pulse 77, temperature 98.3 F (36.8 C), temperature source Oral, resp. rate 18, SpO2 92.00%. The patient is alert and pleasant. He looks well. His strength is normal in all 4 extremities. His dressing is clean and dry without evidence of hematoma or shift.  Disposition: Home  Discharge Orders   Future Orders Complete By Expires   Call MD for:  difficulty breathing, headache or visual disturbances  As directed    Call MD for:  extreme fatigue  As directed    Call MD for:  hives  As directed    Call MD for:  persistant dizziness or light-headedness  As directed    Call MD for:  persistant nausea and vomiting  As directed    Call MD for:  redness, tenderness, or signs of infection (pain, swelling, redness, odor or green/yellow discharge around incision site)  As directed    Call MD for:  severe uncontrolled pain  As directed    Call MD for:  temperature >100.4  As directed    Diet - low sodium heart healthy  As  directed    Discharge instructions  As directed    Comments:     Call [phone] for a followup appointment. Take a stool softener while you are using pain medications.   Driving Restrictions  As directed    Comments:     Do not drive for 2 weeks.   Increase activity slowly  As directed    Lifting restrictions  As directed    Comments:     Do not lift more than 5 pounds. No excessive bending or twisting.   May shower / Bathe  As directed    Comments:     He may shower after the pain she is removed 3 days after surgery. Leave the incision alone.   Remove dressing in 48 hours  As directed    Comments:     Your stitches are under the scan and will dissolve by themselves. The Steri-Strips will fall off after you take a few showers. Do not rub back or pick at the wound, Leave the wound alone.       Medication List         diazepam 5 MG tablet  Commonly known as:  VALIUM  Take 5 mg by mouth every 8 (eight) hours as needed for anxiety. For anxiety     diazepam 5 MG tablet  Commonly known as:  VALIUM  Take 1 tablet (5 mg total) by mouth every 6 (six) hours as needed.     DSS 100 MG Caps  Take 100 mg by mouth 2 (two) times daily.     Oxycodone HCl  20 MG Tabs  Take 1.5 tablets (30 mg total) by mouth every 4 (four) hours as needed.         SignedCristi Loron 04/23/2013, 9:18 AM

## 2013-08-06 ENCOUNTER — Encounter (HOSPITAL_COMMUNITY): Payer: Self-pay | Admitting: Emergency Medicine

## 2013-08-06 ENCOUNTER — Emergency Department (HOSPITAL_COMMUNITY)
Admission: EM | Admit: 2013-08-06 | Discharge: 2013-08-06 | Disposition: A | Discharge status: Home / Self Care | Payer: Worker's Compensation | Attending: Emergency Medicine | Admitting: Emergency Medicine

## 2013-08-06 DIAGNOSIS — M79609 Pain in unspecified limb: Secondary | ICD-10-CM | POA: Insufficient documentation

## 2013-08-06 DIAGNOSIS — M549 Dorsalgia, unspecified: Secondary | ICD-10-CM | POA: Insufficient documentation

## 2013-08-06 DIAGNOSIS — G8929 Other chronic pain: Secondary | ICD-10-CM | POA: Insufficient documentation

## 2013-08-06 DIAGNOSIS — Z87891 Personal history of nicotine dependence: Secondary | ICD-10-CM | POA: Insufficient documentation

## 2013-08-06 DIAGNOSIS — M6281 Muscle weakness (generalized): Secondary | ICD-10-CM | POA: Insufficient documentation

## 2013-08-06 DIAGNOSIS — R209 Unspecified disturbances of skin sensation: Secondary | ICD-10-CM | POA: Insufficient documentation

## 2013-08-06 DIAGNOSIS — Z79899 Other long term (current) drug therapy: Secondary | ICD-10-CM | POA: Insufficient documentation

## 2013-08-06 MED ORDER — HYDROMORPHONE HCL PF 1 MG/ML IJ SOLN
1.0000 mg | Freq: Once | INTRAMUSCULAR | Status: AC
  Administered 2013-08-06: 1 mg via INTRAMUSCULAR
  Filled 2013-08-06: qty 1

## 2013-08-06 MED ORDER — METHOCARBAMOL 750 MG PO TABS: 750.0000 mg | ORAL_TABLET | Freq: Two times a day (BID) | ORAL | Status: DC

## 2013-08-06 MED ORDER — METHOCARBAMOL 500 MG PO TABS
1000.0000 mg | ORAL_TABLET | Freq: Once | ORAL | Status: AC
  Administered 2013-08-06: 1000 mg via ORAL
  Filled 2013-08-06: qty 2

## 2013-08-06 NOTE — ED Provider Notes (Signed)
Pt was seen by Lowella Dell, PA-C and supervised by Dierdre Forth, PA-C and Rochele Raring, MD.    Javier Griffin presents with back pain unchanged from previous. He reports gradually increasing weakness of the left side since September but no falls, known injuries or acute changes. He denies saddle anesthesia, loss of bowel or bladder control or difficulty with ambulation.  He reports no change in his pain or weakness today at all but states that when he called Dr. [name] office he was instructed to come here to the emergency room.  Access to the [facility] controlled substance reporting database which shows that the patient has been written oxycodone 10 mg tablets by Dr. [name] at a quantity of 100 on 06/20/13, 07/06/2013, 07/08/2012 and 07/27/2013.  Discussed the patient with Lupita Leash at Dr. [name] office and asked for guidance on pain control.  She discussed the patient with Dr. [name] who reports that the patient will need to be seen by Dr. Ollen Bowl in the office for a refill or change in medication. Dr. [name] reports the patient has an appointment on Dec 30th and they will discuss a refill on that date, but not before then.   Neuro Exam: Speech is clear and goal oriented, follows commands Major Cranial nerves without deficit, no facial droop 5/5 strength in right upper and lower extremities including dorsiflexion, plantar flexion, and grip strength; 4/5 strength in left upper and lower extremities including dorsiflexion, plantar flexion, and grip strength Sensation normal to light and sharp touch Moves extremities without ataxia, coordination intact Normal finger to nose and rapid alternating movements Neg romberg, no pronator drift Antalgic gait and normal balance  Will treat pain in the ED and he will be d/c with muscle relaxer and no narcotics.     Dahlia Client Nozomi Mettler, PA-C 08/07/13 [phone]
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
## 2013-08-08 NOTE — ED Provider Notes (Signed)
Medical screening examination/treatment/procedure(s) were performed by non-physician practitioner and as supervising physician I was immediately available for consultation/collaboration.  EKG Interpretation   None         Gwendolyn Nishi N Jillian Pianka, DO 08/08/13 1351 

## 2013-08-20 NOTE — ED Provider Notes (Signed)
Medical screening examination/treatment/procedure(s) were performed by non-physician practitioner and as supervising physician I was immediately available for consultation/collaboration.  EKG Interpretation   None         Layla MawKristen N Cherri Yera, DO 08/20/13 1310

## 2013-08-30 ENCOUNTER — Other Ambulatory Visit (HOSPITAL_COMMUNITY): Payer: Self-pay | Admitting: Neurosurgery

## 2013-08-30 DIAGNOSIS — M545 Low back pain, unspecified: Secondary | ICD-10-CM

## 2013-08-30 DIAGNOSIS — Z981 Arthrodesis status: Secondary | ICD-10-CM

## 2013-09-01 ENCOUNTER — Encounter (HOSPITAL_COMMUNITY): Payer: Self-pay | Admitting: Pharmacy Technician

## 2013-09-03 ENCOUNTER — Other Ambulatory Visit (HOSPITAL_COMMUNITY): Payer: Self-pay | Admitting: Neurosurgery

## 2013-09-03 ENCOUNTER — Other Ambulatory Visit: Payer: Self-pay | Admitting: Radiology

## 2013-09-03 ENCOUNTER — Encounter (HOSPITAL_COMMUNITY): Payer: Self-pay

## 2013-09-03 ENCOUNTER — Ambulatory Visit (HOSPITAL_COMMUNITY)
Admission: RE | Admit: 2013-09-03 | Discharge: 2013-09-03 | Disposition: A | Discharge status: Home / Self Care | Payer: Worker's Compensation | Source: Ambulatory Visit | Attending: Neurosurgery | Admitting: Neurosurgery

## 2013-09-03 DIAGNOSIS — Z981 Arthrodesis status: Secondary | ICD-10-CM | POA: Insufficient documentation

## 2013-09-03 DIAGNOSIS — M545 Low back pain, unspecified: Secondary | ICD-10-CM

## 2013-09-03 DIAGNOSIS — T84498A Other mechanical complication of other internal orthopedic devices, implants and grafts, initial encounter: Secondary | ICD-10-CM | POA: Insufficient documentation

## 2013-09-03 DIAGNOSIS — Y838 Other surgical procedures as the cause of abnormal reaction of the patient, or of later complication, without mention of misadventure at the time of the procedure: Secondary | ICD-10-CM | POA: Insufficient documentation

## 2013-09-03 DIAGNOSIS — M5020 Other cervical disc displacement, unspecified cervical region: Secondary | ICD-10-CM | POA: Insufficient documentation

## 2013-09-03 MED ORDER — DIAZEPAM 2 MG PO TABS: 10.0000 mg | ORAL_TABLET | Freq: Once | ORAL | Status: DC

## 2013-09-03 MED ORDER — OXYCODONE-ACETAMINOPHEN 5-325 MG PO TABS
ORAL_TABLET | ORAL | Status: DC
Start: 2013-09-03 — End: 2013-09-04
  Filled 2013-09-03: qty 2

## 2013-09-03 MED ORDER — OXYCODONE-ACETAMINOPHEN 5-325 MG PO TABS
2.0000 | ORAL_TABLET | Freq: Once | ORAL | Status: AC
  Administered 2013-09-03: 2 via ORAL
  Filled 2013-09-03: qty 2

## 2013-09-03 MED ORDER — IOHEXOL 300 MG/ML  SOLN
10.0000 mL | Freq: Once | INTRAMUSCULAR | Status: AC | PRN
  Administered 2013-09-03: 10 mL via INTRATHECAL

## 2013-09-03 NOTE — Progress Notes (Signed)
Pt post myelogram co of pain rated 15/10 on pain scale. Pt requesting a shot. Order received for 2 percocet 5/325.  given to patient see mar. md aware. Pt also requesting valium not given was only written pre procedure.

## 2013-09-03 NOTE — Discharge Instructions (Signed)
Myelography °Care After °These instructions give you information on caring for yourself after your procedure. Your doctor may also give you specific instructions. Call your doctor if you have any problems or questions after your procedure. °HOME CARE °· Rest often the first day. °· When you rest, lie flat, with your head slightly raised (elevated). °· Avoid heavy lifting and activity for 48 hours. °· You may take the bandage (dressing) off 1 day after the test. °GET HELP RIGHT AWAY IF:  °· You have a very bad headache. °· You have a fever. °MAKE SURE YOU: °· Understand these instructions. °· Will watch your condition. °· Will get help right away if you are not doing well or get worse. °Document Released: 05/14/2008 Document Revised: 07/22/2012 Document Reviewed: 04/29/2012 °ExitCare® Patient Information ©2014 [employer]. ° °

## 2014-05-18 ENCOUNTER — Other Ambulatory Visit (HOSPITAL_COMMUNITY): Payer: Self-pay | Admitting: Neurosurgery

## 2014-05-18 ENCOUNTER — Encounter (HOSPITAL_COMMUNITY): Payer: Self-pay | Admitting: Pharmacy Technician

## 2014-05-24 ENCOUNTER — Other Ambulatory Visit (HOSPITAL_COMMUNITY): Payer: Self-pay | Admitting: *Deleted

## 2014-05-24 ENCOUNTER — Encounter (HOSPITAL_COMMUNITY)
Admission: RE | Admit: 2014-05-24 | Discharge: 2014-05-24 | Disposition: A | Discharge status: Home / Self Care | Payer: Worker's Compensation | Source: Ambulatory Visit | Attending: Anesthesiology | Admitting: Anesthesiology

## 2014-05-24 ENCOUNTER — Encounter (HOSPITAL_COMMUNITY)
Admission: RE | Admit: 2014-05-24 | Discharge: 2014-05-24 | Disposition: A | Discharge status: Home / Self Care | Payer: Worker's Compensation | Source: Ambulatory Visit | Attending: Neurosurgery | Admitting: Neurosurgery

## 2014-05-24 ENCOUNTER — Encounter (HOSPITAL_COMMUNITY): Payer: Self-pay

## 2014-05-24 DIAGNOSIS — M1990 Unspecified osteoarthritis, unspecified site: Secondary | ICD-10-CM | POA: Diagnosis not present

## 2014-05-24 DIAGNOSIS — Z0000 Encounter for general adult medical examination without abnormal findings: Secondary | ICD-10-CM | POA: Diagnosis present

## 2014-05-24 DIAGNOSIS — X58XXXA Exposure to other specified factors, initial encounter: Secondary | ICD-10-CM | POA: Diagnosis not present

## 2014-05-24 DIAGNOSIS — I10 Essential (primary) hypertension: Secondary | ICD-10-CM | POA: Insufficient documentation

## 2014-05-24 DIAGNOSIS — Y998 Other external cause status: Secondary | ICD-10-CM | POA: Diagnosis not present

## 2014-05-24 DIAGNOSIS — S129XXA Fracture of neck, unspecified, initial encounter: Secondary | ICD-10-CM | POA: Diagnosis not present

## 2014-05-24 DIAGNOSIS — Y9389 Activity, other specified: Secondary | ICD-10-CM | POA: Diagnosis not present

## 2014-05-24 DIAGNOSIS — Y9289 Other specified places as the place of occurrence of the external cause: Secondary | ICD-10-CM | POA: Insufficient documentation

## 2014-05-24 HISTORY — DX: Bronchitis, not specified as acute or chronic: J40

## 2014-05-24 HISTORY — DX: Headache, unspecified: R51.9

## 2014-05-24 HISTORY — DX: Essential (primary) hypertension: I10

## 2014-05-24 HISTORY — DX: Headache: R51

## 2014-05-24 HISTORY — DX: Unspecified injury of neck, initial encounter: S19.9XXA

## 2014-05-24 HISTORY — DX: Unspecified osteoarthritis, unspecified site: M19.90

## 2014-05-24 LAB — SURGICAL PCR SCREEN
MRSA, PCR: NEGATIVE
STAPHYLOCOCCUS AUREUS: POSITIVE — AB

## 2014-05-24 LAB — CBC
HEMATOCRIT: 44.1 % (ref 39.0–52.0)
Hemoglobin: 15.3 g/dL (ref 13.0–17.0)
MCH: 30 pg (ref 26.0–34.0)
MCHC: 34.7 g/dL (ref 30.0–36.0)
MCV: 86.5 fL (ref 78.0–100.0)
Platelets: 228 10*3/uL (ref 150–400)
RBC: 5.1 MIL/uL (ref 4.22–5.81)
RDW: 12.5 % (ref 11.5–15.5)
WBC: 7.8 10*3/uL (ref 4.0–10.5)

## 2014-05-24 NOTE — Progress Notes (Signed)
Pt's PCP is [facility]vin[facility]. Called that office for last OV notes and possible EKG. Last OV was May, 2014 and there is an EKG from 2012. Requested that those be faxed to us.

## 2014-05-24 NOTE — Progress Notes (Signed)
05/24/14 0928  OBSTRUCTIVE SLEEP APNEA  Have you ever been diagnosed with sleep apnea through a sleep study? No  Do you snore loudly (loud enough to be heard through closed doors)?  1  Do you often feel tired, fatigued, or sleepy during the daytime? 1  Has anyone observed you stop breathing during your sleep? 0  Do you have, or are you being treated for high blood pressure? 1  BMI more than 35 kg/m2? 0  Age over 41 years old? 0  Neck circumference greater than 40 cm/16 inches? 1  Gender: 1  Obstructive Sleep Apnea Score 5  Score 4 or greater  Results sent to PCP

## 2014-05-24 NOTE — Progress Notes (Addendum)
Anesthesia Chart Review:  Patient is a 43 year old male scheduled for posterior C4-T1 fusion on 05/26/14 by [name].  History includes non-smoker, HTN (reportedly not required medication for last few years), headaches related to neck pain (history of injury), arthritis, exploration of cervical fusion with removal of C5-7 plate and F6-O1C7-T1 ACDF 04/2013. OSA screening score was elevated at 5. PCP is Toni ArthursBob Chauvin, [facility]-C with [facility].    Meds: Cymbalta, Valium (awaiting refill), Colace, oxycodone, Opana ER.  PAT BP was elevated at 176/120, 170/122 and following his PAT interview 177/103.  His initial BP readings were just after walking into PAT.  He said his pain was 10/10 and worse after the "long" walk in. He is also was out of Valium that helps with his muscle spasms.  He reports that his BP is typically higher at the MD office, so he has a home monitor with BP readings ~ 130-150/75-90.  (Of note, his PAT RN felt his behavior was consistent with someone in a lot of pain.)  EKG on 05/24/14 showed: NSR, moderate voltage criteria for LVH, baseline wanderer. (Staff said patient was unable to lie completely still for EKG due to pain.) Interpreting cardiologist did not feel the tracing was significantly changed from prior tracing on 04/24/07.   CXR on 05/24/14 showed: No active cardiopulmonary disease.  Preoperative labs noted.  BMET was not done at PAT, so it will need to be done on the day of surgery.  (Cannot be added to today's labs.)  In regards to his elevated BP today, it's unclear to know how much pain is playing a part. He did report his pain was 10/10 and his BP was at least a little better after resting from his walk inside the hospital. By his account, home readings have been much better.  However, I had his PAT RN advise him that uncontrolled hypertension including diastolic readings around what they initially were at PAT may lead to cancellation of his surgery.  Also that he  should seek evaluation/medication refill to better control his pain that may be effecting his BP (he is planning to contact [name] for a refill on his Valium) and that he should monitor his BP at home ~ 2X/day and contact his PCP for evaluation if his DBP is > 100 and ideally should have his PCP office recheck his BP anyway. I have left a voice message with Darl PikesSusan at Dr. York RamJenkin's office to contact me for an update regarding his elevated BP readings.  If his BP and BMET results are reasonable on the day of surgery then I would anticipate that he could proceed. Further evaluation by his assigned anesthesiologist on the day of surgery. Case is currently scheduled for ~ 2 PM. (Update: Patient called this afternoon to report home BP reading at 2PM was 152/93 and at 4 PM was 156/92.)  Velna Ochsllison Melanee Cordial, PA-C [facility]Anesthesiology Phone [phone] 05/24/2014 2:02 PM

## 2014-05-24 NOTE — Progress Notes (Signed)
Pt's BP is elevated today. He states it's due to the pain that he is having and the long walk into the hospital. Pt appears very uncomfortable while in PAT appt. Cannot get comfortable sitting or standing. He states that he has a problem with his BP being high any time he comes to the hospital or doctor's office. He checks his BP at home and he states it's usually between 75-90 diastolic and 130-150 systolic. He states he was treated for a short time for HTN but no longer takes any medication, states he was only on it for a "short time" a few years ago. When asked if he has chest pain, he states that he has shooting pain from his neck into his chest. He reports that the neurosurgeon PA states that it's due to his neck. He describes it as a feeling of being "shocked" sometimes when he moves his neck. Only occurs when he moves his neck, never at rest. Pt has been out of his Valium for a few days along with his Cymbalta. Encouraged him to get those refilled and start taking them as prescribed. I spoke with Revonda StandardAllison, she suggested that pt see his PCP for a BP check and to check it at home. She agreed that getting his Valium refilled might be of help also. I explained to pt that his BP could be a factor of having his surgery cancelled if he comes in on DOS and it's elevated. I gave him the suggestion of seeing his PCP and checking and recording his home BP's. He voiced understanding. He states he will  call [name] office to get a refill on his Valium also.

## 2014-05-24 NOTE — Pre-Procedure Instructions (Signed)
Javier Griffin  05/24/2014   Your procedure is scheduled on:  Thursday, May 26, 2014 at 2:00 PM.   Report to [facility] Entrance "A" Admitting Office at 12:00 NOON.   Call this number if you have problems the morning of surgery: [phone]   Remember:   Do not eat food or drink liquids after midnight Wednesday, 05/25/14.   Take these medicines the morning of surgery with A SIP OF WATER: DULoxetine (CYMBALTA), oxymorphone (OPANA ER)    Do not wear jewelry.  Do not wear lotions, powders, or cologne. You may wear deodorant.  Men may shave face and neck.  Do not bring valuables to the hospital.  [facility] is not responsible                  for any belongings or valuables.               Contacts, dentures or bridgework may not be worn into surgery.  Leave suitcase in the car. After surgery it may be brought to your room.  For patients admitted to the hospital, discharge time is determined by your                treatment team.               Special Instructions: [facility] - Preparing for Surgery  Before surgery, you can play an important role.  Because skin is not sterile, your skin needs to be as free of germs as possible.  You can reduce the number of germs on you skin by washing with CHG (chlorahexidine gluconate) soap before surgery.  CHG is an antiseptic cleaner which kills germs and bonds with the skin to continue killing germs even after washing.  Please DO NOT use if you have an allergy to CHG or antibacterial soaps.  If your skin becomes reddened/irritated stop using the CHG and inform your nurse when you arrive at Short Stay.  Do not shave (including legs and underarms) for at least 48 hours prior to the first CHG shower.  You may shave your face.  Please follow these instructions carefully:   1.  Shower with CHG Soap the night before surgery and the                                morning of Surgery.  2.  If you choose to wash your hair, wash your hair first  as usual with your       normal shampoo.  3.  After you shampoo, rinse your hair and body thoroughly to remove the                      Shampoo.  4.  Use CHG as you would any other liquid soap.  You can apply chg directly       to the skin and wash gently with scrungie or a clean washcloth.  5.  Apply the CHG Soap to your body ONLY FROM THE NECK DOWN.        Do not use on open wounds or open sores.  Avoid contact with your eyes, ears, mouth and genitals (private parts).  Wash genitals (private parts) with your normal soap.  6.  Wash thoroughly, paying special attention to the area where your surgery        will be performed.  7.  Thoroughly rinse your body  with warm water from the neck down.  8.  DO NOT shower/wash with your normal soap after using and rinsing off       the CHG Soap.  9.  Pat yourself dry with a clean towel.            10.  Wear clean pajamas.            11.  Place clean sheets on your bed the night of your first shower and do not        sleep with pets.  Day of Surgery  Do not apply any lotions the morning of surgery.  Please wear clean clothes to the hospital/surgery center.     Please read over the following fact sheets that you were given: Pain Booklet, Coughing and Deep Breathing, MRSA Information and Surgical Site Infection Prevention

## 2014-05-25 MED ORDER — CEFAZOLIN SODIUM-DEXTROSE 2-3 GM-% IV SOLR
2.0000 g | INTRAVENOUS | Status: AC
  Administered 2014-05-26: 2 g via INTRAVENOUS
  Filled 2014-05-25: qty 50

## 2014-05-26 ENCOUNTER — Encounter (HOSPITAL_COMMUNITY)
Admission: RE | Disposition: A | Discharge status: Home / Self Care | Payer: Self-pay | Source: Ambulatory Visit | Attending: Neurosurgery

## 2014-05-26 ENCOUNTER — Inpatient Hospital Stay (HOSPITAL_COMMUNITY)
Admission: RE | Admit: 2014-05-26 | Discharge: 2014-05-29 | DRG: 460 | Disposition: A | Discharge status: Home / Self Care | Payer: Worker's Compensation | Source: Ambulatory Visit | Attending: Neurosurgery | Admitting: Neurosurgery

## 2014-05-26 ENCOUNTER — Inpatient Hospital Stay (HOSPITAL_COMMUNITY): Payer: Worker's Compensation

## 2014-05-26 ENCOUNTER — Encounter (HOSPITAL_COMMUNITY): Payer: Worker's Compensation | Admitting: Vascular Surgery

## 2014-05-26 ENCOUNTER — Inpatient Hospital Stay (HOSPITAL_COMMUNITY): Payer: Worker's Compensation | Admitting: Critical Care Medicine

## 2014-05-26 ENCOUNTER — Encounter (HOSPITAL_COMMUNITY): Payer: Self-pay | Admitting: *Deleted

## 2014-05-26 DIAGNOSIS — M4302 Spondylolysis, cervical region: Secondary | ICD-10-CM | POA: Diagnosis present

## 2014-05-26 DIAGNOSIS — S129XXA Fracture of neck, unspecified, initial encounter: Secondary | ICD-10-CM | POA: Diagnosis present

## 2014-05-26 DIAGNOSIS — M5412 Radiculopathy, cervical region: Secondary | ICD-10-CM | POA: Diagnosis present

## 2014-05-26 DIAGNOSIS — Z87891 Personal history of nicotine dependence: Secondary | ICD-10-CM | POA: Diagnosis not present

## 2014-05-26 DIAGNOSIS — Z79899 Other long term (current) drug therapy: Secondary | ICD-10-CM | POA: Diagnosis not present

## 2014-05-26 DIAGNOSIS — Y839 Surgical procedure, unspecified as the cause of abnormal reaction of the patient, or of later complication, without mention of misadventure at the time of the procedure: Secondary | ICD-10-CM | POA: Diagnosis present

## 2014-05-26 DIAGNOSIS — Z79891 Long term (current) use of opiate analgesic: Secondary | ICD-10-CM | POA: Diagnosis not present

## 2014-05-26 DIAGNOSIS — M960 Pseudarthrosis after fusion or arthrodesis: Secondary | ICD-10-CM | POA: Diagnosis present

## 2014-05-26 DIAGNOSIS — M479 Spondylosis, unspecified: Secondary | ICD-10-CM | POA: Diagnosis present

## 2014-05-26 HISTORY — PX: POSTERIOR CERVICAL FUSION/FORAMINOTOMY: SHX5038

## 2014-05-26 LAB — BASIC METABOLIC PANEL
Anion gap: 11 (ref 5–15)
BUN: 13 mg/dL (ref 6–23)
CHLORIDE: 101 meq/L (ref 96–112)
CO2: 26 mEq/L (ref 19–32)
Calcium: 9.3 mg/dL (ref 8.4–10.5)
Creatinine, Ser: 0.81 mg/dL (ref 0.50–1.35)
GFR calc Af Amer: >90 mL/min (ref >90)
GLUCOSE: 101 mg/dL — AB (ref 70–99)
POTASSIUM: 4.3 meq/L (ref 3.7–5.3)
SODIUM: 138 meq/L (ref 137–147)

## 2014-05-26 SURGERY — POSTERIOR CERVICAL FUSION/FORAMINOTOMY LEVEL 4
Anesthesia: General | Site: Neck

## 2014-05-26 MED ORDER — DIPHENHYDRAMINE HCL 12.5 MG/5ML PO ELIX
12.5000 mg | ORAL_SOLUTION | Freq: Four times a day (QID) | ORAL | Status: DC | PRN
  Filled 2014-05-26: qty 5

## 2014-05-26 MED ORDER — BUPIVACAINE-EPINEPHRINE (PF) 0.5% -1:200000 IJ SOLN
INTRAMUSCULAR | Status: DC | PRN
  Administered 2014-05-26: 10 mL

## 2014-05-26 MED ORDER — MORPHINE SULFATE (PF) 1 MG/ML IV SOLN
INTRAVENOUS | Status: DC
  Administered 2014-05-26: 20:00:00 via INTRAVENOUS
  Administered 2014-05-27: 18.63 mg via INTRAVENOUS
  Administered 2014-05-27: 16.5 mg via INTRAVENOUS
  Administered 2014-05-27: 03:00:00 via INTRAVENOUS
  Filled 2014-05-26: qty 25

## 2014-05-26 MED ORDER — OXYCODONE HCL 5 MG PO TABS
30.0000 mg | ORAL_TABLET | ORAL | Status: DC | PRN
  Administered 2014-05-26: 30 mg via ORAL

## 2014-05-26 MED ORDER — OXYCODONE HCL 5 MG PO TABS
ORAL_TABLET | ORAL | Status: AC
  Filled 2014-05-26: qty 1

## 2014-05-26 MED ORDER — CEFAZOLIN SODIUM-DEXTROSE 2-3 GM-% IV SOLR
2.0000 g | Freq: Three times a day (TID) | INTRAVENOUS | Status: AC
  Administered 2014-05-26 – 2014-05-27 (×2): 2 g via INTRAVENOUS
  Filled 2014-05-26 (×2): qty 50

## 2014-05-26 MED ORDER — MENTHOL 3 MG MT LOZG: 1.0000 | LOZENGE | OROMUCOSAL | Status: DC | PRN

## 2014-05-26 MED ORDER — BUPIVACAINE LIPOSOME 1.3 % IJ SUSP
20.0000 mL | INTRAMUSCULAR | Status: AC
  Filled 2014-05-26: qty 20

## 2014-05-26 MED ORDER — PROPOFOL 10 MG/ML IV BOLUS
INTRAVENOUS | Status: AC
  Filled 2014-05-26: qty 20

## 2014-05-26 MED ORDER — STERILE WATER FOR INJECTION IJ SOLN
INTRAMUSCULAR | Status: AC
  Filled 2014-05-26: qty 10

## 2014-05-26 MED ORDER — PHENOL 1.4 % MT LIQD: 1.0000 | OROMUCOSAL | Status: DC | PRN

## 2014-05-26 MED ORDER — LIDOCAINE HCL (CARDIAC) 20 MG/ML IV SOLN
INTRAVENOUS | Status: AC
  Filled 2014-05-26: qty 5

## 2014-05-26 MED ORDER — DEXAMETHASONE SODIUM PHOSPHATE 4 MG/ML IJ SOLN
INTRAMUSCULAR | Status: AC
  Filled 2014-05-26: qty 1

## 2014-05-26 MED ORDER — OXYCODONE HCL 5 MG PO TABS
ORAL_TABLET | ORAL | Status: AC
  Filled 2014-05-26: qty 6

## 2014-05-26 MED ORDER — DEXAMETHASONE SODIUM PHOSPHATE 4 MG/ML IJ SOLN
INTRAMUSCULAR | Status: DC | PRN
  Administered 2014-05-26: 4 mg via INTRAVENOUS

## 2014-05-26 MED ORDER — DIAZEPAM 5 MG PO TABS
5.0000 mg | ORAL_TABLET | Freq: Four times a day (QID) | ORAL | Status: DC | PRN
  Administered 2014-05-27 – 2014-05-28 (×4): 5 mg via ORAL
  Filled 2014-05-26 (×4): qty 1

## 2014-05-26 MED ORDER — SODIUM CHLORIDE 0.9 % IJ SOLN: 9.0000 mL | INTRAMUSCULAR | Status: DC | PRN

## 2014-05-26 MED ORDER — HYDROMORPHONE HCL 1 MG/ML IJ SOLN
INTRAMUSCULAR | Status: AC
  Administered 2014-05-26: 0.5 mg via INTRAVENOUS
  Filled 2014-05-26: qty 2

## 2014-05-26 MED ORDER — LIDOCAINE HCL (CARDIAC) 20 MG/ML IV SOLN
INTRAVENOUS | Status: DC | PRN
  Administered 2014-05-26: 40 mg via INTRAVENOUS

## 2014-05-26 MED ORDER — OXYCODONE-ACETAMINOPHEN 5-325 MG PO TABS
1.0000 | ORAL_TABLET | ORAL | Status: DC | PRN
  Administered 2014-05-26 – 2014-05-29 (×5): 2 via ORAL
  Filled 2014-05-26 (×5): qty 2

## 2014-05-26 MED ORDER — 0.9 % SODIUM CHLORIDE (POUR BTL) OPTIME
TOPICAL | Status: DC | PRN
  Administered 2014-05-26: 1000 mL

## 2014-05-26 MED ORDER — DOCUSATE SODIUM 100 MG PO CAPS
100.0000 mg | ORAL_CAPSULE | Freq: Two times a day (BID) | ORAL | Status: DC
  Administered 2014-05-26 – 2014-05-29 (×6): 100 mg via ORAL
  Filled 2014-05-26 (×6): qty 1

## 2014-05-26 MED ORDER — MIDAZOLAM HCL 5 MG/5ML IJ SOLN
INTRAMUSCULAR | Status: DC | PRN
  Administered 2014-05-26: 2 mg via INTRAVENOUS

## 2014-05-26 MED ORDER — ONDANSETRON HCL 4 MG/2ML IJ SOLN
INTRAMUSCULAR | Status: DC | PRN
  Administered 2014-05-26: 4 mg via INTRAVENOUS

## 2014-05-26 MED ORDER — ONDANSETRON HCL 4 MG/2ML IJ SOLN: 4.0000 mg | INTRAMUSCULAR | Status: DC | PRN

## 2014-05-26 MED ORDER — ONDANSETRON HCL 4 MG/2ML IJ SOLN
INTRAMUSCULAR | Status: AC
  Filled 2014-05-26: qty 2

## 2014-05-26 MED ORDER — MIDAZOLAM HCL 2 MG/2ML IJ SOLN
INTRAMUSCULAR | Status: AC
  Filled 2014-05-26: qty 2

## 2014-05-26 MED ORDER — PROPOFOL 10 MG/ML IV BOLUS
INTRAVENOUS | Status: DC | PRN
  Administered 2014-05-26: 100 mg via INTRAVENOUS
  Administered 2014-05-26: 50 mg via INTRAVENOUS
  Administered 2014-05-26: 200 mg via INTRAVENOUS
  Administered 2014-05-26: 50 mg via INTRAVENOUS

## 2014-05-26 MED ORDER — ONDANSETRON HCL 4 MG/2ML IJ SOLN
4.0000 mg | Freq: Four times a day (QID) | INTRAMUSCULAR | Status: DC | PRN
  Filled 2014-05-26: qty 2

## 2014-05-26 MED ORDER — ARTIFICIAL TEARS OP OINT
TOPICAL_OINTMENT | OPHTHALMIC | Status: DC | PRN
  Administered 2014-05-26: 1 via OPHTHALMIC

## 2014-05-26 MED ORDER — GLYCOPYRROLATE 0.2 MG/ML IJ SOLN
INTRAMUSCULAR | Status: DC | PRN
  Administered 2014-05-26: 0.6 mg via INTRAVENOUS

## 2014-05-26 MED ORDER — FENTANYL CITRATE 0.05 MG/ML IJ SOLN
INTRAMUSCULAR | Status: AC
  Filled 2014-05-26: qty 5

## 2014-05-26 MED ORDER — BUPIVACAINE LIPOSOME 1.3 % IJ SUSP
INTRAMUSCULAR | Status: DC | PRN
  Administered 2014-05-26: 20 mL

## 2014-05-26 MED ORDER — NEOSTIGMINE METHYLSULFATE 10 MG/10ML IV SOLN
INTRAVENOUS | Status: DC | PRN
  Administered 2014-05-26: 4 mg via INTRAVENOUS

## 2014-05-26 MED ORDER — MORPHINE SULFATE (PF) 1 MG/ML IV SOLN
INTRAVENOUS | Status: AC
  Filled 2014-05-26: qty 25

## 2014-05-26 MED ORDER — HYDROCODONE-ACETAMINOPHEN 5-325 MG PO TABS
1.0000 | ORAL_TABLET | ORAL | Status: DC | PRN
  Filled 2014-05-26: qty 2

## 2014-05-26 MED ORDER — OXYCODONE HCL 5 MG/5ML PO SOLN: 5.0000 mg | Freq: Once | ORAL | Status: AC | PRN

## 2014-05-26 MED ORDER — ACETAMINOPHEN 325 MG PO TABS: 650.0000 mg | ORAL_TABLET | ORAL | Status: DC | PRN

## 2014-05-26 MED ORDER — MORPHINE SULFATE ER 15 MG PO TBCR
60.0000 mg | EXTENDED_RELEASE_TABLET | Freq: Two times a day (BID) | ORAL | Status: DC
  Administered 2014-05-26 – 2014-05-29 (×6): 60 mg via ORAL
  Filled 2014-05-26 (×6): qty 4

## 2014-05-26 MED ORDER — HYDROMORPHONE HCL 1 MG/ML IJ SOLN
0.2500 mg | INTRAMUSCULAR | Status: DC | PRN
  Administered 2014-05-26 (×4): 0.5 mg via INTRAVENOUS

## 2014-05-26 MED ORDER — ROCURONIUM BROMIDE 50 MG/5ML IV SOLN
INTRAVENOUS | Status: AC
  Filled 2014-05-26: qty 1

## 2014-05-26 MED ORDER — VECURONIUM BROMIDE 10 MG IV SOLR
INTRAVENOUS | Status: DC | PRN
  Administered 2014-05-26 (×2): 1 mg via INTRAVENOUS
  Administered 2014-05-26 (×2): 2 mg via INTRAVENOUS
  Administered 2014-05-26: 1 mg via INTRAVENOUS
  Administered 2014-05-26: 2 mg via INTRAVENOUS

## 2014-05-26 MED ORDER — SODIUM CHLORIDE 0.9 % IR SOLN
Status: DC | PRN
  Administered 2014-05-26: 17:00:00

## 2014-05-26 MED ORDER — DIAZEPAM 5 MG PO TABS
ORAL_TABLET | ORAL | Status: AC
Start: 2014-05-26 — End: 2014-05-27
  Filled 2014-05-26: qty 1

## 2014-05-26 MED ORDER — LACTATED RINGERS IV SOLN
INTRAVENOUS | Status: DC
  Administered 2014-05-26: 21:00:00 via INTRAVENOUS

## 2014-05-26 MED ORDER — LACTATED RINGERS IV SOLN
INTRAVENOUS | Status: DC
  Administered 2014-05-26 (×2): via INTRAVENOUS

## 2014-05-26 MED ORDER — DULOXETINE HCL 60 MG PO CPEP
60.0000 mg | ORAL_CAPSULE | Freq: Every day | ORAL | Status: DC
  Administered 2014-05-26 – 2014-05-29 (×4): 60 mg via ORAL
  Filled 2014-05-26 (×4): qty 1

## 2014-05-26 MED ORDER — BACITRACIN ZINC 500 UNIT/GM EX OINT
TOPICAL_OINTMENT | CUTANEOUS | Status: DC | PRN
  Administered 2014-05-26: 1 via TOPICAL

## 2014-05-26 MED ORDER — ACETAMINOPHEN 650 MG RE SUPP: 650.0000 mg | RECTAL | Status: DC | PRN

## 2014-05-26 MED ORDER — FENTANYL CITRATE 0.05 MG/ML IJ SOLN
INTRAMUSCULAR | Status: DC | PRN
  Administered 2014-05-26 (×6): 50 ug via INTRAVENOUS
  Administered 2014-05-26: 100 ug via INTRAVENOUS
  Administered 2014-05-26 (×2): 50 ug via INTRAVENOUS
  Administered 2014-05-26: 100 ug via INTRAVENOUS
  Administered 2014-05-26 (×3): 50 ug via INTRAVENOUS

## 2014-05-26 MED ORDER — MORPHINE SULFATE 2 MG/ML IJ SOLN
1.0000 mg | INTRAMUSCULAR | Status: DC | PRN
  Administered 2014-05-27 (×2): 2 mg via INTRAVENOUS
  Administered 2014-05-27: 4 mg via INTRAVENOUS
  Administered 2014-05-27: 2 mg via INTRAVENOUS
  Administered 2014-05-27 – 2014-05-28 (×6): 4 mg via INTRAVENOUS
  Filled 2014-05-26: qty 1
  Filled 2014-05-26 (×2): qty 2
  Filled 2014-05-26: qty 1
  Filled 2014-05-26 (×3): qty 2
  Filled 2014-05-26: qty 1
  Filled 2014-05-26 (×2): qty 2

## 2014-05-26 MED ORDER — ONDANSETRON HCL 4 MG/2ML IJ SOLN: 4.0000 mg | Freq: Once | INTRAMUSCULAR | Status: DC | PRN

## 2014-05-26 MED ORDER — VECURONIUM BROMIDE 10 MG IV SOLR
INTRAVENOUS | Status: AC
  Filled 2014-05-26: qty 10

## 2014-05-26 MED ORDER — DIPHENHYDRAMINE HCL 50 MG/ML IJ SOLN
12.5000 mg | Freq: Four times a day (QID) | INTRAMUSCULAR | Status: DC | PRN
  Filled 2014-05-26: qty 0.25

## 2014-05-26 MED ORDER — ALUM & MAG HYDROXIDE-SIMETH 200-200-20 MG/5ML PO SUSP: 30.0000 mL | Freq: Four times a day (QID) | ORAL | Status: DC | PRN

## 2014-05-26 MED ORDER — OXYCODONE HCL 5 MG PO TABS
5.0000 mg | ORAL_TABLET | Freq: Once | ORAL | Status: AC | PRN
  Administered 2014-05-26: 5 mg via ORAL

## 2014-05-26 MED ORDER — NALOXONE HCL 0.4 MG/ML IJ SOLN
0.4000 mg | INTRAMUSCULAR | Status: DC | PRN
  Filled 2014-05-26: qty 1

## 2014-05-26 MED ORDER — ROCURONIUM BROMIDE 100 MG/10ML IV SOLN
INTRAVENOUS | Status: DC | PRN
  Administered 2014-05-26: 50 mg via INTRAVENOUS

## 2014-05-26 MED ORDER — DIAZEPAM 5 MG PO TABS
5.0000 mg | ORAL_TABLET | Freq: Three times a day (TID) | ORAL | Status: DC | PRN
  Administered 2014-05-26: 5 mg via ORAL

## 2014-05-26 MED ORDER — THROMBIN 20000 UNITS EX SOLR
CUTANEOUS | Status: DC | PRN
  Administered 2014-05-26: 17:00:00 via TOPICAL

## 2014-05-26 SURGICAL SUPPLY — 81 items
BAG DECANTER FOR FLEXI CONT (MISCELLANEOUS) ×3 IMPLANT
BENZOIN TINCTURE PRP APPL 2/3 (GAUZE/BANDAGES/DRESSINGS) ×3 IMPLANT
BIT DRILL ANGLD TIP SCRW 3.5 (BIT) ×3 IMPLANT
BIT DRILL NEURO 2X3.1 SFT TUCH (MISCELLANEOUS) ×2 IMPLANT
BLADE CLIPPER SURG (BLADE) ×3 IMPLANT
BLADE SURG 11 STRL SS (BLADE) ×3 IMPLANT
BLADE ULTRA TIP 2M (BLADE) IMPLANT
BRUSH SCRUB EZ 1% IODOPHOR (MISCELLANEOUS) IMPLANT
BRUSH SCRUB EZ PLAIN DRY (MISCELLANEOUS) ×3 IMPLANT
CANISTER SUCT 3000ML (MISCELLANEOUS) ×3 IMPLANT
CAP ELLIPSE LOCKING (Cap) ×24 IMPLANT
CLOSURE WOUND 1/2 X4 (GAUZE/BANDAGES/DRESSINGS) ×1
CONT SPEC 4OZ CLIKSEAL STRL BL (MISCELLANEOUS) ×3 IMPLANT
DECANTER SPIKE VIAL GLASS SM (MISCELLANEOUS) ×3 IMPLANT
DRAPE C-ARM 42X72 X-RAY (DRAPES) ×6 IMPLANT
DRAPE LAPAROTOMY 100X72 PEDS (DRAPES) ×3 IMPLANT
DRAPE MICROSCOPE LEICA (MISCELLANEOUS) IMPLANT
DRAPE POUCH INSTRU U-SHP 10X18 (DRAPES) ×3 IMPLANT
DRAPE SURG 17X23 STRL (DRAPES) ×9 IMPLANT
DRILL NEURO 2X3.1 SOFT TOUCH (MISCELLANEOUS) ×6
ELECT BLADE 4.0 EZ CLEAN MEGAD (MISCELLANEOUS) ×3
ELECT REM PT RETURN 9FT ADLT (ELECTROSURGICAL) ×3
ELECTRODE BLDE 4.0 EZ CLN MEGD (MISCELLANEOUS) ×1 IMPLANT
ELECTRODE REM PT RTRN 9FT ADLT (ELECTROSURGICAL) ×1 IMPLANT
GAUZE SPONGE 4X4 12PLY STRL (GAUZE/BANDAGES/DRESSINGS) ×3 IMPLANT
GAUZE SPONGE 4X4 16PLY XRAY LF (GAUZE/BANDAGES/DRESSINGS) IMPLANT
GLOVE BIO SURGEON STRL SZ8.5 (GLOVE) ×6 IMPLANT
GLOVE BIOGEL PI IND STRL 7.0 (GLOVE) ×1 IMPLANT
GLOVE BIOGEL PI IND STRL 7.5 (GLOVE) ×2 IMPLANT
GLOVE BIOGEL PI IND STRL 8.5 (GLOVE) ×1 IMPLANT
GLOVE BIOGEL PI INDICATOR 7.0 (GLOVE) ×2
GLOVE BIOGEL PI INDICATOR 7.5 (GLOVE) ×4
GLOVE BIOGEL PI INDICATOR 8.5 (GLOVE) ×2
GLOVE ECLIPSE 7.5 STRL STRAW (GLOVE) ×3 IMPLANT
GLOVE ECLIPSE 8.5 STRL (GLOVE) ×3 IMPLANT
GLOVE EXAM NITRILE LRG STRL (GLOVE) IMPLANT
GLOVE EXAM NITRILE MD LF STRL (GLOVE) ×6 IMPLANT
GLOVE EXAM NITRILE XL STR (GLOVE) IMPLANT
GLOVE EXAM NITRILE XS STR PU (GLOVE) IMPLANT
GLOVE SS BIOGEL STRL SZ 6.5 (GLOVE) ×2 IMPLANT
GLOVE SS BIOGEL STRL SZ 8 (GLOVE) ×2 IMPLANT
GLOVE SUPERSENSE BIOGEL SZ 6.5 (GLOVE) ×4
GLOVE SUPERSENSE BIOGEL SZ 8 (GLOVE) ×4
GOWN STRL REUS W/ TWL LRG LVL3 (GOWN DISPOSABLE) ×1 IMPLANT
GOWN STRL REUS W/ TWL XL LVL3 (GOWN DISPOSABLE) ×3 IMPLANT
GOWN STRL REUS W/TWL 2XL LVL3 (GOWN DISPOSABLE) ×3 IMPLANT
GOWN STRL REUS W/TWL LRG LVL3 (GOWN DISPOSABLE) ×2
GOWN STRL REUS W/TWL XL LVL3 (GOWN DISPOSABLE) ×6
KIT BASIN OR (CUSTOM PROCEDURE TRAY) ×3 IMPLANT
KIT INFUSE XX SMALL 0.7CC (Orthopedic Implant) ×3 IMPLANT
KIT ROOM TURNOVER OR (KITS) ×3 IMPLANT
MARKER SKIN DUAL TIP RULER LAB (MISCELLANEOUS) ×3 IMPLANT
NEEDLE HYPO 21X1.5 SAFETY (NEEDLE) ×3 IMPLANT
NEEDLE HYPO 22GX1.5 SAFETY (NEEDLE) ×3 IMPLANT
NEEDLE SPNL 18GX3.5 QUINCKE PK (NEEDLE) IMPLANT
NS IRRIG 1000ML POUR BTL (IV SOLUTION) ×3 IMPLANT
PACK LAMINECTOMY NEURO (CUSTOM PROCEDURE TRAY) ×3 IMPLANT
PAD ARMBOARD 7.5X6 YLW CONV (MISCELLANEOUS) ×9 IMPLANT
PATTIES SURGICAL .25X.25 (GAUZE/BANDAGES/DRESSINGS) IMPLANT
PIN MAYFIELD SKULL DISP (PIN) ×3 IMPLANT
PUTTY BIOACTIVE 10CC KINEX (Putty) ×6 IMPLANT
ROD 3.5X240MM (Rod) ×3 IMPLANT
RUBBERBAND STERILE (MISCELLANEOUS) IMPLANT
SCREW ELLIPSE 14X3.5MM (Screw) ×18 IMPLANT
SCREW ELLIPSE 3.5X20MM (Screw) ×3 IMPLANT
SPONGE LAP 4X18 X RAY DECT (DISPOSABLE) IMPLANT
SPONGE NEURO XRAY DETECT 1X3 (DISPOSABLE) IMPLANT
SPONGE SURGIFOAM ABS GEL SZ50 (HEMOSTASIS) ×3 IMPLANT
STAPLER SKIN PROX WIDE 3.9 (STAPLE) IMPLANT
STRIP CLOSURE SKIN 1/2X4 (GAUZE/BANDAGES/DRESSINGS) ×2 IMPLANT
SUT ETHILON 2 0 FS 18 (SUTURE) IMPLANT
SUT VIC AB 0 CT1 18XCR BRD8 (SUTURE) ×2 IMPLANT
SUT VIC AB 0 CT1 8-18 (SUTURE) ×4
SUT VIC AB 2-0 CP2 18 (SUTURE) ×6 IMPLANT
SYR 20CC LL (SYRINGE) ×3 IMPLANT
SYR 20ML ECCENTRIC (SYRINGE) ×3 IMPLANT
TAPE CLOTH SURG 4X10 WHT LF (GAUZE/BANDAGES/DRESSINGS) ×3 IMPLANT
TOWEL OR 17X24 6PK STRL BLUE (TOWEL DISPOSABLE) ×3 IMPLANT
TOWEL OR 17X26 10 PK STRL BLUE (TOWEL DISPOSABLE) ×3 IMPLANT
TRAY FOLEY CATH 14FRSI W/METER (CATHETERS) ×3 IMPLANT
WATER STERILE IRR 1000ML POUR (IV SOLUTION) ×3 IMPLANT
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
## 2014-05-26 NOTE — Progress Notes (Signed)
Subjective:  The patient is somnolent but easily arousable. He is in no apparent distress.  Objective: Vital signs in last 24 hours: Temp:  [98.4 F (36.9 C)-98.8 F (37.1 C)] 98.8 F (37.1 C) (10/08 1851) Pulse Rate:  [74] 74 (10/08 1217) Resp:  [20] 20 (10/08 1217) BP: (136)/(85) 136/85 mmHg (10/08 1217) SpO2:  [97 %] 97 % (10/08 1217) Weight:  [96.48 kg (212 lb 11.2 oz)] 96.48 kg (212 lb 11.2 oz) (10/08 1217)  Intake/Output from previous day:   Intake/Output this shift: Total I/O In: 1500 [I.V.:1500] Out: 225 [Urine:175; Blood:50]  Physical exam  Patient is somnolent but easily arousable. He is moving all 4 extremities well.  Lab Results:  Recent Labs  05/24/14 0949  WBC 7.8  HGB 15.3  HCT 44.1  PLT 228   BMET  Recent Labs  05/26/14 1223  NA 138  K 4.3  CL 101  CO2 26  GLUCOSE 101*  BUN 13  CREATININE 0.81  CALCIUM 9.3    Studies/Results: No results found.  Assessment/Plan: The patient is doing well. I spoke with his family.  LOS: 0 days     Koron Godeaux D 05/26/2014, 6:55 PM

## 2014-05-26 NOTE — Progress Notes (Signed)
Patient arrived to 4N04 AAOx4. Vital signs taken, questions answered and call bell by his side. PCA in place. Will continue to monitor. Malia Corsi, [place] ScrapeSarah E

## 2014-05-26 NOTE — H&P (Signed)
Subjective: Patient is a 43 year old white male on whom a performed previous anterior cervical surgeries. He has had persistent neck and arm pain. He has failed medical management. He has been worked up with a cervical MRI and myelo CT. There is  a possible pseudoarthrosis at C7-T1 and a spondylitic arthroplasty at C4-5. We discussed the various treatment options including a posterior cervical instrumentation and fusion. The patient has decided proceed with that surgery.   Past Medical History  Diagnosis Date  . Neck injury   . Hypertension     treated at one time, no medications at this time  . Bronchitis   . Headache     due to neck pain  . Arthritis     Past Surgical History  Procedure Laterality Date  . Cervical spine surgery      x3  . Carpel tunnel Left     and elbow   . Anterior cervical decomp/discectomy fusion N/A 04/22/2013    Procedure: ANTERIOR CERVICAL DECOMPRESSION/DISCECTOMY FUSION 1 CERVICAL SEVEN-THORACIC ONELEVEL/HARDWARE REMOVAL;  Surgeon: Javier LoronJeffrey D Elleigh Cassetta, MD;  Location: [facility];  Service: Neurosurgery;  Laterality: N/A;  C7/T1 anterior cervical decompression with fusion interbody prothesis plating and bonegraft with removal of codman slim loc plate  . Elbow surgery Left     nerve release  . Wisdom tooth extraction      No Known Allergies  History  Substance Use Topics  . Smoking status: Never Smoker   . Smokeless tobacco: Former NeurosurgeonUser    Types: Snuff    Quit date: 05/24/1992  . Alcohol Use: Yes     Comment: occasional    Family History  Problem Relation Age of Onset  . Lung cancer Mother   . Hypertension Father    Prior to Admission medications   Medication Sig Start Date End Date Taking? Authorizing Provider  diazepam (VALIUM) 5 MG tablet Take 5 mg by mouth every 8 (eight) hours as needed for anxiety. For anxiety   Yes Historical Provider, MD  docusate sodium (COLACE) 50 MG capsule Take 50 mg by mouth 2 (two) times daily.   Yes Historical  Provider, MD  DULoxetine (CYMBALTA) 60 MG capsule Take 60 mg by mouth daily.   Yes Historical Provider, MD  oxyCODONE (ROXICODONE) 15 MG immediate release tablet Take 15 mg by mouth every 4 (four) hours as needed for pain.   Yes Historical Provider, MD  oxymorphone (OPANA ER) 15 MG 12 hr tablet Take 15 mg by mouth every 12 (twelve) hours.   Yes Historical Provider, MD     Review of Systems  Positive ROS: as above   All other systems have been reviewed and were otherwise negative with the exception of those mentioned in the HPI and as above.  Objective: Vital signs in last 24 hours: Temp:  [98.4 F (36.9 C)] 98.4 F (36.9 C) (10/08 1217) Pulse Rate:  [74] 74 (10/08 1217) Resp:  [20] 20 (10/08 1217) BP: (136)/(85) 136/85 mmHg (10/08 1217) SpO2:  [97 %] 97 % (10/08 1217) Weight:  [96.48 kg (212 lb 11.2 oz)] 96.48 kg (212 lb 11.2 oz) (10/08 1217)  General Appearance: Alert, cooperative, no distress, Head: Normocephalic, without obvious abnormality, atraumatic Eyes: PERRL, conjunctiva/corneas clear, EOM's intact,    Ears: Normal  Throat: Normal  Neck: Supple, symmetrical, trachea midline, no adenopathy; thyroid: No enlargement/tenderness/nodules; no carotid bruit or JVD. [name] cervical incisions are well-healed.  Back: Symmetric, no curvature, ROM normal, no CVA tenderness Lungs: Clear to auscultation bilaterally, respirations  unlabored Heart: Regular rate and rhythm, no murmur, rub or gallop Abdomen: Soft, non-tender,, no masses, no organomegaly Extremities: Extremities normal, atraumatic, no cyanosis or edema Pulses: 2+ and symmetric all extremities Skin: Skin color, texture, turgor normal, no rashes or lesions  NEUROLOGIC:   Mental status: alert and oriented, no aphasia, good attention span, Fund of knowledge/ memory ok Motor Exam - grossly normal Sensory Exam - grossly normal Reflexes:  Coordination - grossly normal Gait - grossly normal Balance - grossly  normal Cranial Nerves: I: smell Not tested  II: visual acuity  OS: Normal  OD: Normal   II: visual fields Full to confrontation  II: pupils Equal, round, reactive to light  III,VII: ptosis None  III,IV,VI: extraocular muscles  Full ROM  V: mastication Normal  V: facial light touch sensation  Normal  V,VII: corneal reflex  Present  VII: facial muscle function - upper  Normal  VII: facial muscle function - lower Normal  VIII: hearing Not tested  IX: soft palate elevation  Normal  IX,X: gag reflex Present  XI: trapezius strength  5/5  XI: sternocleidomastoid strength 5/5  XI: neck flexion strength  5/5  XII: tongue strength  Normal    Data Review Lab Results  Component Value Date   WBC 7.8 05/24/2014   HGB 15.3 05/24/2014   HCT 44.1 05/24/2014   MCV 86.5 05/24/2014   PLT 228 05/24/2014   Lab Results  Component Value Date   NA 138 05/26/2014   K 4.3 05/26/2014   CL 101 05/26/2014   CO2 26 05/26/2014   BUN 13 05/26/2014   CREATININE 0.81 05/26/2014   GLUCOSE 101* 05/26/2014   No results found for this basename: INR, PROTIME    Assessment/Plan:  C4-5 arthroplasty, cervicalgia, C7-T1 pseudoarthrosis, cervicalgia, cervical radiculopathy: I discussed the situation with the patient. I reviewed his imaging studies with them and pointed out the abnormalities. We have discussed the various treatment options including a posterior cervical instrumentation and fusion. I described the surgery to him. I have shown him surgical models. We have discussed the risks, benefits, alternatives, and likelihood of achieving our goals with surgery. I have answered all the patient's questions. He has decided proceed with surgery.    Javier Griffin D 05/26/2014 3:41 PM

## 2014-05-26 NOTE — Anesthesia Preprocedure Evaluation (Addendum)
Anesthesia Evaluation  Patient identified by MRN, date of birth, ID band Patient awake    Reviewed: Allergy & Precautions, H&P , NPO status , Patient's Chart, lab work & pertinent test results  Airway Mallampati: II TM Distance: >3 FB Neck ROM: Full    Dental  (+) Teeth Intact, Dental Advisory Given   Pulmonary  breath sounds clear to auscultation        Cardiovascular hypertension, Rhythm:Regular Rate:Normal     Neuro/Psych  Headaches,    GI/Hepatic   Endo/Other    Renal/GU      Musculoskeletal  (+) Arthritis -,   Abdominal   Peds  Hematology   Anesthesia Other Findings   Reproductive/Obstetrics                          Anesthesia Physical Anesthesia Plan  ASA: II  Anesthesia Plan: General   Post-op Pain Management:    Induction: Intravenous  Airway Management Planned: Oral ETT  Additional Equipment:   Intra-op Plan:   Post-operative Plan: Extubation in OR  Informed Consent: I have reviewed the patients History and Physical, chart, labs and discussed the procedure including the risks, benefits and alternatives for the proposed anesthesia with the patient or authorized representative who has indicated his/her understanding and acceptance.   Dental advisory given  Plan Discussed with: Anesthesiologist, Surgeon and CRNA  Anesthesia Plan Comments:        Anesthesia Quick Evaluation

## 2014-05-26 NOTE — Anesthesia Procedure Notes (Signed)
Procedure Name: Intubation Date/Time: 05/26/2014 3:49 PM Performed by: Elon AlasLEE, Brailon Don BROWN Pre-anesthesia Checklist: Patient identified, Timeout performed, Emergency Drugs available, Patient being monitored and Suction available Patient Re-evaluated:Patient Re-evaluated prior to inductionOxygen Delivery Method: Circle system utilized Preoxygenation: Pre-oxygenation with 100% oxygen Intubation Type: IV induction Ventilation: Mask ventilation without difficulty Laryngoscope Size: Mac and 4 Grade View: Grade I Tube type: Oral Tube size: 8.0 mm Number of attempts: 1 Airway Equipment and Method: Stylet Placement Confirmation: CO2 detector,  positive ETCO2,  ETT inserted through vocal cords under direct vision and breath sounds checked- equal and bilateral Secured at: 23 cm Tube secured with: Tape Dental Injury: Teeth and Oropharynx as per pre-operative assessment

## 2014-05-27 MED ORDER — OXYCODONE HCL 5 MG PO TABS: 15.0000 mg | ORAL_TABLET | ORAL | Status: DC | PRN

## 2014-05-27 MED ORDER — OXYMORPHONE HCL ER 15 MG PO TB12: 30.0000 mg | ORAL_TABLET | Freq: Two times a day (BID) | ORAL | Status: DC

## 2014-05-27 MED ORDER — INFLUENZA VAC SPLIT QUAD 0.5 ML IM SUSY
0.5000 mL | PREFILLED_SYRINGE | INTRAMUSCULAR | Status: AC
  Administered 2014-05-28: 0.5 mL via INTRAMUSCULAR
  Filled 2014-05-27: qty 0.5

## 2014-05-27 MED ORDER — OXYCODONE HCL 5 MG PO TABS
30.0000 mg | ORAL_TABLET | ORAL | Status: DC | PRN
  Administered 2014-05-27 – 2014-05-29 (×10): 30 mg via ORAL
  Filled 2014-05-27 (×10): qty 6

## 2014-05-27 MED ORDER — DIAZEPAM 5 MG PO TABS: 5.0000 mg | ORAL_TABLET | Freq: Four times a day (QID) | ORAL | Status: DC | PRN

## 2014-05-27 MED ORDER — OXYCODONE HCL 15 MG PO TABS: 30.0000 mg | ORAL_TABLET | ORAL | Status: DC | PRN

## 2014-05-27 NOTE — Anesthesia Postprocedure Evaluation (Signed)
  Anesthesia Post-op Note  Patient: Javier Griffin  Procedure(s) Performed: Procedure(s): Cervical four-five, Cervical seven-Thoracic one Posterior Cervical Instrumentation and Fusion (N/A)  Patient Location: PACU  Anesthesia Type:General  Level of Consciousness: awake  Airway and Oxygen Therapy: Patient Spontanous Breathing and Patient connected to nasal cannula oxygen  Post-op Pain: moderate  Post-op Assessment: Post-op Vital signs reviewed, Patient's Cardiovascular Status Stable, Respiratory Function Stable, Patent Airway, No signs of Nausea or vomiting and Pain level controlled  Post-op Vital Signs: Reviewed and stable  Last Vitals:  Filed Vitals:   05/27/14 0933  BP: 134/81  Pulse: 87  Temp: 36.4 C  Resp: 16    Complications: No apparent anesthesia complications

## 2014-05-27 NOTE — Transfer of Care (Signed)
Immediate Anesthesia Transfer of Care Note  Patient: Javier Griffin  Procedure(s) Performed: Procedure(s): Cervical four-five, Cervical seven-Thoracic one Posterior Cervical Instrumentation and Fusion (N/A)  Patient Location: PACU  Anesthesia Type:General  Level of Consciousness: awake, alert , oriented and patient cooperative  Airway & Oxygen Therapy: Patient Spontanous Breathing and Patient connected to nasal cannula oxygen  Post-op Assessment: Report given to PACU RN, Post -op Vital signs reviewed and stable and Patient moving all extremities X 4  Post vital signs: Reviewed and stable  Complications: No apparent anesthesia complications
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
## 2014-05-27 NOTE — Progress Notes (Signed)
UR complete.  Altariq Goodall RN, MSN 

## 2014-05-27 NOTE — Progress Notes (Signed)
Patient not able to urinate.  Amanda, NT, performed in and out catheterization using sterile technique.  900 ml clear, yellow urine drained.  Patient tolerated well.  Will continue to monitor.  Mariam DollarAnna Shakayla Hickox,RN

## 2014-05-27 NOTE — Progress Notes (Signed)
Patient attempted to void on the commode.  He sat for 15 minutes with no luck.  Patient states that he "is not going to have the catheter put back in."  This Rn educated patient regarding the need to urinate and patient just states over and over that he is not going to have the catheter put back in.  Will continue to monitor.  Mariam DollarAnna Meeghan Skipper,RN

## 2014-05-27 NOTE — Progress Notes (Signed)
Patient ID: Javier Griffin, male   DOB: 09/01/1970, 43 y.o.   MRN: [id number] Subjective:  The patient is alert and pleasant. His neck is appropriately sore. He has no radicular pain. The patient has a very high tolerance to pain medication secondary to chronic narcotic use.  Objective: Vital signs in last 24 hours: Temp:  [97.6 F (36.4 C)-98.8 F (37.1 C)] 97.6 F (36.4 C) (10/09 0933) Pulse Rate:  [74-105] 87 (10/09 0933) Resp:  [14-27] 16 (10/09 0933) BP: (102-156)/(61-107) 134/81 mmHg (10/09 0933) SpO2:  [93 %-100 %] 96 % (10/09 0933) FiO2 (%):  [100 %] 100 % (10/09 0309) Weight:  [96.48 kg (212 lb 11.2 oz)] 96.48 kg (212 lb 11.2 oz) (10/08 2039)  Intake/Output from previous day: 10/08 0701 - 10/09 0700 In: 1500 [I.V.:1500] Out: 4875 [Urine:4825; Blood:50] Intake/Output this shift: Total I/O In: 360 [P.O.:360] Out: -   Physical exam the patient is alert and pleasant. Her strength is normal in all 4 extremities.  Lab Results: No results found for this basename: WBC, HGB, HCT, PLT,  in the last 72 hours BMET  Recent Labs  05/26/14 1223  NA 138  K 4.3  CL 101  CO2 26  GLUCOSE 101*  BUN 13  CREATININE 0.81  CALCIUM 9.3    Studies/Results: Dg Cervical Spine 1 View  05/27/2014   CLINICAL DATA:  C4-T1 fusion.  EXAM: DG CERVICAL SPINE - 1 VIEW; DG C-ARM 61-120 MIN  COMPARISON:  Cervical spine myelogram 09/03/2013.  FINDINGS: A single intraoperative image demonstrates a prosthetic disc at the C4-5 level. This is incompletely imaged.  IMPRESSION: Postoperative changes at C4-5.   Electronically Signed   By: Gennette Pachris  Mattern M.D.   On: 05/27/2014 00:26   Dg C-arm 61-120 Min  05/27/2014   CLINICAL DATA:  C4-T1 fusion.  EXAM: DG CERVICAL SPINE - 1 VIEW; DG C-ARM 61-120 MIN  COMPARISON:  Cervical spine myelogram 09/03/2013.  FINDINGS: A single intraoperative image demonstrates a prosthetic disc at the C4-5 level. This is incompletely imaged.  IMPRESSION: Postoperative changes at  C4-5.   Electronically Signed   By: Gennette Pachris  Mattern M.D.   On: 05/27/2014 00:26    Assessment/Plan: Postop day 1: The patient seems to be doing well. We will discontinue his PCA and mobilize him. He will likely go home tomorrow.  LOS: 1 day     Karolina Zamor D 05/27/2014, 10:58 AM

## 2014-05-28 NOTE — Progress Notes (Signed)
Patient ID: Javier Griffin, male   DOB: 03/16/1971, 43 y.o.   MRN: [id number] Subjective:  the patient is alert and pleasant. His neck is sore. He does not feel he is ready to go home yet and  Objective: Vital signs in last 24 hours: Temp:  [97.5 F (36.4 C)-100 F (37.8 C)] 97.5 F (36.4 C) (10/10 0539) Pulse Rate:  [85-97] 97 (10/10 0539) Resp:  [14-22] 20 (10/10 0539) BP: (134-151)/(79-102) 140/96 mmHg (10/10 0539) SpO2:  [91 %-96 %] 95 % (10/10 0539)  Intake/Output from previous day: 10/09 0701 - 10/10 0700 In: 840 [P.O.:840] Out: 900 [Urine:900] Intake/Output this shift:    Physical exam the patient is alert and oriented x3. His strength is normal. His dressing is clean and dry but tattered.  Lab Results: No results found for this basename: WBC, HGB, HCT, PLT,  in the last 72 hours BMET  Recent Labs  05/26/14 1223  NA 138  K 4.3  CL 101  CO2 26  GLUCOSE 101*  BUN 13  CREATININE 0.81  CALCIUM 9.3    Studies/Results: Dg Cervical Spine 1 View  05/27/2014   CLINICAL DATA:  C4-T1 fusion.  EXAM: DG CERVICAL SPINE - 1 VIEW; DG C-ARM 61-120 MIN  COMPARISON:  Cervical spine myelogram 09/03/2013.  FINDINGS: A single intraoperative image demonstrates a prosthetic disc at the C4-5 level. This is incompletely imaged.  IMPRESSION: Postoperative changes at C4-5.   Electronically Signed   By: Gennette Pachris  Mattern M.D.   On: 05/27/2014 00:26   Dg C-arm 61-120 Min  05/27/2014   CLINICAL DATA:  C4-T1 fusion.  EXAM: DG CERVICAL SPINE - 1 VIEW; DG C-ARM 61-120 MIN  COMPARISON:  Cervical spine myelogram 09/03/2013.  FINDINGS: A single intraoperative image demonstrates a prosthetic disc at the C4-5 level. This is incompletely imaged.  IMPRESSION: Postoperative changes at C4-5.   Electronically Signed   By: Gennette Pachris  Mattern M.D.   On: 05/27/2014 00:26    Assessment/Plan: Postop day #2: I encouraged the patient to mobilize and convert to by mouth pain medications. He will likely go home tomorrow.    LOS: 2 days     Jeet Shough D 05/28/2014, 7:32 AM

## 2014-05-29 NOTE — Progress Notes (Signed)
Patient and family given DC instructions. All questioned answered. Patient medicated with prn IR to assist with comfort on way home. Instructed not to take more prn until 5PM. [facility] home by mother.

## 2014-05-29 NOTE — Discharge Summary (Signed)
Physician Discharge Summary  Patient ID: Javier Griffin MRN: [id number] DOB/AGE: 41/03/1971 41 y.o.  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 The surgery went well.  [name] postoperative course was remarkable only for some difficulty with pain management.  The patient has a high tolerance to pain medications secondary to chronic use.  On postop day #3, i.e. 05/29/2014 the patient requested discharge to home. The patient, and his mother, were given oral and written discharge instructions. All her questions were answered.  We discussed the proper use of his medications.I have told him that he must take them as directed and failure to do so would result in this not refilling them early, possible overdose, et Karie Sodacetera. They understand.  Consults:none Significant Diagnostic Studies:none Treatments:C4-5 and C7-T1 posterior instrumentation and fusion. Discharge Exam: Blood pressure 113/72, pulse 95, temperature 97.4 F (36.3 C), temperature source Oral, resp. rate 20, height 6' (1.829 m), weight 96.48 kg (212 lb 11.2 oz), SpO2 94.00%. The patient is alert and pleasant. He looks well. His strength is normal in all 4 extremities. His dressing is clean and dry.  Disposition: Home  Discharge Instructions   Call MD for:  difficulty breathing, headache or visual disturbances    Complete by:  As directed      Call MD for:  extreme fatigue    Complete by:  As directed      Call MD for:  hives    Complete by:  As directed      Call MD for:  persistant dizziness or light-headedness    Complete by:  As directed      Call MD for:  persistant nausea and  vomiting    Complete by:  As directed      Call MD for:  redness, tenderness, or signs of infection (pain, swelling, redness, odor or green/yellow discharge around incision site)    Complete by:  As directed      Call MD for:  severe uncontrolled pain    Complete by:  As directed      Call MD for:  temperature >100.4    Complete by:  As directed      Diet - low sodium heart healthy    Complete by:  As directed      Discharge instructions    Complete by:  As directed   Call [phone] for a followup appointment. Take a stool softener while you are using pain medications.     Driving Restrictions    Complete by:  As directed   Do not drive for 2 weeks.     Increase activity slowly    Complete by:  As directed      Lifting restrictions    Complete by:  As directed   Do not lift more than 5 pounds. No excessive bending or twisting.     May shower / Bathe    Complete by:  As directed   He may shower after the pain she is removed 3 days after surgery. Leave the incision alone.     No dressing needed    Complete by:  As directed             Medication List  diazepam 5 MG tablet  Commonly known as:  VALIUM  Take 5 mg by mouth every 8 (eight) hours as needed for anxiety. For anxiety     diazepam 5 MG tablet  Commonly known as:  VALIUM  Take 1 tablet (5 mg total) by mouth every 6 (six) hours as needed for muscle spasms.     docusate sodium 50 MG capsule  Commonly known as:  COLACE  Take 50 mg by mouth 2 (two) times daily.     DULoxetine 60 MG capsule  Commonly known as:  CYMBALTA  Take 60 mg by mouth daily.     oxyCODONE 15 MG immediate release tablet  Commonly known as:  ROXICODONE  Take 2 tablets (30 mg total) by mouth every 4 (four) hours as needed for pain.     oxymorphone 15 MG 12 hr tablet  Commonly known as:  OPANA ER  Take 2 tablets (30 mg total) by mouth every 12 (twelve) hours.         SignedTressie Stalker: Maleeha Halls D 05/29/2014, 10:19 AM

## 2014-05-30 ENCOUNTER — Encounter (HOSPITAL_COMMUNITY): Payer: Self-pay | Admitting: Neurosurgery

## 2014-07-25 ENCOUNTER — Ambulatory Visit: Payer: Self-pay | Admitting: Family Medicine

## 2014-12-09 ENCOUNTER — Ambulatory Visit
Admit: 2014-12-09 | Disposition: A | Discharge status: Home / Self Care | Payer: Self-pay | Attending: Neurosurgery | Admitting: Neurosurgery

## 2014-12-09 LAB — CBC WITH DIFFERENTIAL/PLATELET
BASOS PCT: 2 %
Basophil #: 0.2 10*3/uL — ABNORMAL HIGH (ref 0.0–0.1)
EOS ABS: 0.2 10*3/uL (ref 0.0–0.7)
EOS PCT: 2.5 %
HCT: 43.5 % (ref 40.0–52.0)
HGB: 14.5 g/dL (ref 13.0–18.0)
LYMPHS PCT: 24.7 %
Lymphocyte #: 2.4 10*3/uL (ref 1.0–3.6)
MCH: 29.1 pg (ref 26.0–34.0)
MCHC: 33.4 g/dL (ref 32.0–36.0)
MCV: 87 fL (ref 80–100)
MONOS PCT: 9.7 %
Monocyte #: 0.9 x10 3/mm (ref 0.2–1.0)
Neutrophil #: 5.8 10*3/uL (ref 1.4–6.5)
Neutrophil %: 61.1 %
Platelet: 330 10*3/uL (ref 150–440)
RBC: 4.98 10*6/uL (ref 4.40–5.90)
RDW: 13.2 % (ref 11.5–14.5)
WBC: 9.6 10*3/uL (ref 3.8–10.6)

## 2014-12-09 LAB — PROTIME-INR
INR: 0.8
Prothrombin Time: 11.7 secs

## 2015-01-25 ENCOUNTER — Other Ambulatory Visit: Payer: Self-pay | Admitting: Family Medicine

## 2015-01-25 DIAGNOSIS — I10 Essential (primary) hypertension: Secondary | ICD-10-CM

## 2015-01-31 ENCOUNTER — Other Ambulatory Visit: Payer: Self-pay | Admitting: Neurosurgery

## 2015-02-06 NOTE — Pre-Procedure Instructions (Addendum)
[facility]  02/06/2015      [facility] [id number] [place], [place] - [address] AT [facility] OF [address] [place] [address] Phone: [phone] Fax: [phone]  [facility] [phone] [place], [place] - [address] [name] [address] Phone: [phone] Fax: [phone]    Your procedure is scheduled on June 22nd, Wednesday.  Report to [facility] Admitting at 12:30 pm.             (Arrival time is per your surgeon's request).   Call this number if you have problems the morning of surgery:  [phone]   Remember:  Do not eat food or drink liquids after midnight Tuesday.  Take these medicines the morning of surgery with A SIP OF WATER : Norvasc, Valium, Cymbalta, Prilosec, Pain medication.  Please use your flonase the morning of surgery.   Do not wear jewelry - no rings or watches.  Do not wear lotions or colognes.   You may NOT wear deodorant the morning of surgery.             Men may shave face and neck.   Do not bring valuables to the hospital.  [facility] is not responsible for any belongings or valuables. Contacts, dentures or bridgework may not be worn into surgery.  Leave your suitcase in the car.  After surgery it may be brought to your room. For patients admitted to the hospital, discharge time will be determined by your treatment team.  Name and phone number of your driver:     Special instructions:  "Preparing for Surgery" instruction sheet.  Please read over the following fact sheets that you were given. Pain Booklet, Coughing and Deep Breathing, MRSA Information and Surgical Site Infection Prevention

## 2015-02-07 ENCOUNTER — Encounter (HOSPITAL_COMMUNITY)
Admission: RE | Admit: 2015-02-07 | Discharge: 2015-02-07 | Disposition: A | Discharge status: Home / Self Care | Payer: BLUE CROSS/BLUE SHIELD | Source: Ambulatory Visit | Attending: Neurosurgery | Admitting: Neurosurgery

## 2015-02-07 ENCOUNTER — Encounter (HOSPITAL_COMMUNITY): Payer: Self-pay

## 2015-02-07 DIAGNOSIS — Z01812 Encounter for preprocedural laboratory examination: Secondary | ICD-10-CM | POA: Diagnosis not present

## 2015-02-07 DIAGNOSIS — M546 Pain in thoracic spine: Secondary | ICD-10-CM | POA: Diagnosis not present

## 2015-02-07 HISTORY — DX: Gastro-esophageal reflux disease without esophagitis: K21.9

## 2015-02-07 LAB — CBC
HCT: 40.2 % (ref 39.0–52.0)
HEMOGLOBIN: 14 g/dL (ref 13.0–17.0)
MCH: 29.7 pg (ref 26.0–34.0)
MCHC: 34.8 g/dL (ref 30.0–36.0)
MCV: 85.2 fL (ref 78.0–100.0)
Platelets: 269 10*3/uL (ref 150–400)
RBC: 4.72 MIL/uL (ref 4.22–5.81)
RDW: 13 % (ref 11.5–15.5)
WBC: 7.2 10*3/uL (ref 4.0–10.5)

## 2015-02-07 LAB — BASIC METABOLIC PANEL WITH GFR
Anion gap: 8 (ref 5–15)
BUN: 8 mg/dL (ref 6–20)
CO2: 27 mmol/L (ref 22–32)
Calcium: 9.2 mg/dL (ref 8.9–10.3)
Chloride: 102 mmol/L (ref 101–111)
Creatinine, Ser: 0.59 mg/dL — ABNORMAL LOW (ref 0.61–1.24)
GFR calc Af Amer: >60 mL/min
GFR calc non Af Amer: >60 mL/min
Glucose, Bld: 116 mg/dL — ABNORMAL HIGH (ref 65–99)
Potassium: 3.9 mmol/L (ref 3.5–5.1)
Sodium: 137 mmol/L (ref 135–145)

## 2015-02-07 LAB — SURGICAL PCR SCREEN
MRSA, PCR: NEGATIVE
Staphylococcus aureus: POSITIVE — AB

## 2015-02-07 MED ORDER — CEFAZOLIN SODIUM-DEXTROSE 2-3 GM-% IV SOLR
2.0000 g | INTRAVENOUS | Status: AC
  Administered 2015-02-08: 2 g via INTRAVENOUS
  Filled 2015-02-07: qty 50

## 2015-02-07 NOTE — Progress Notes (Addendum)
Goes to [facility]. Sees B Chauvin, PA.  Has been going frequently since starting BP medication.   Denies any cardiac issues.   DA

## 2015-02-08 ENCOUNTER — Ambulatory Visit (HOSPITAL_COMMUNITY): Payer: Worker's Compensation | Admitting: Certified Registered Nurse Anesthetist

## 2015-02-08 ENCOUNTER — Ambulatory Visit (HOSPITAL_COMMUNITY): Payer: Worker's Compensation

## 2015-02-08 ENCOUNTER — Ambulatory Visit (HOSPITAL_COMMUNITY)
Admission: RE | Admit: 2015-02-08 | Discharge: 2015-02-09 | Disposition: A | Discharge status: Home / Self Care | Payer: Worker's Compensation | Source: Ambulatory Visit | Attending: Neurosurgery | Admitting: Neurosurgery

## 2015-02-08 ENCOUNTER — Encounter (HOSPITAL_COMMUNITY)
Admission: RE | Disposition: A | Discharge status: Home / Self Care | Payer: Self-pay | Source: Ambulatory Visit | Attending: Neurosurgery

## 2015-02-08 ENCOUNTER — Encounter (HOSPITAL_COMMUNITY): Payer: Self-pay | Admitting: *Deleted

## 2015-02-08 DIAGNOSIS — G8929 Other chronic pain: Secondary | ICD-10-CM | POA: Diagnosis not present

## 2015-02-08 DIAGNOSIS — T8489XA Other specified complication of internal orthopedic prosthetic devices, implants and grafts, initial encounter: Secondary | ICD-10-CM | POA: Insufficient documentation

## 2015-02-08 DIAGNOSIS — M1990 Unspecified osteoarthritis, unspecified site: Secondary | ICD-10-CM | POA: Insufficient documentation

## 2015-02-08 DIAGNOSIS — I10 Essential (primary) hypertension: Secondary | ICD-10-CM | POA: Diagnosis not present

## 2015-02-08 DIAGNOSIS — M542 Cervicalgia: Secondary | ICD-10-CM | POA: Diagnosis present

## 2015-02-08 DIAGNOSIS — K219 Gastro-esophageal reflux disease without esophagitis: Secondary | ICD-10-CM | POA: Diagnosis not present

## 2015-02-08 DIAGNOSIS — Z419 Encounter for procedure for purposes other than remedying health state, unspecified: Secondary | ICD-10-CM

## 2015-02-08 DIAGNOSIS — Y793 Surgical instruments, materials and orthopedic devices (including sutures) associated with adverse incidents: Secondary | ICD-10-CM | POA: Diagnosis not present

## 2015-02-08 HISTORY — PX: POSTERIOR CERVICAL LAMINECTOMY: SHX2248

## 2015-02-08 SURGERY — POSTERIOR CERVICAL LAMINECTOMY
Anesthesia: General

## 2015-02-08 MED ORDER — EPHEDRINE SULFATE 50 MG/ML IJ SOLN
INTRAMUSCULAR | Status: DC | PRN
  Administered 2015-02-08 (×3): 5 mg via INTRAVENOUS
  Administered 2015-02-08: 10 mg via INTRAVENOUS

## 2015-02-08 MED ORDER — BUPIVACAINE-EPINEPHRINE 0.5% -1:200000 IJ SOLN
INTRAMUSCULAR | Status: DC | PRN
  Administered 2015-02-08: 10 mL

## 2015-02-08 MED ORDER — LIDOCAINE HCL 4 % MT SOLN
OROMUCOSAL | Status: DC | PRN
  Administered 2015-02-08: 4 mL via TOPICAL

## 2015-02-08 MED ORDER — ONDANSETRON HCL 4 MG/2ML IJ SOLN: 4.0000 mg | Freq: Once | INTRAMUSCULAR | Status: DC | PRN

## 2015-02-08 MED ORDER — FENTANYL CITRATE (PF) 250 MCG/5ML IJ SOLN
INTRAMUSCULAR | Status: AC
  Filled 2015-02-08: qty 5

## 2015-02-08 MED ORDER — HYDROMORPHONE HCL 1 MG/ML IJ SOLN
0.5000 mg | INTRAMUSCULAR | Status: AC | PRN
  Administered 2015-02-08 (×4): 0.5 mg via INTRAVENOUS

## 2015-02-08 MED ORDER — MORPHINE SULFATE 2 MG/ML IJ SOLN
1.0000 mg | INTRAMUSCULAR | Status: DC | PRN
  Administered 2015-02-08: 2 mg via INTRAVENOUS
  Administered 2015-02-09 (×3): 4 mg via INTRAVENOUS
  Filled 2015-02-08 (×3): qty 2
  Filled 2015-02-08: qty 1

## 2015-02-08 MED ORDER — OXYCODONE HCL 5 MG PO TABS
30.0000 mg | ORAL_TABLET | ORAL | Status: DC | PRN
  Administered 2015-02-08 – 2015-02-09 (×3): 30 mg via ORAL
  Filled 2015-02-08 (×3): qty 6

## 2015-02-08 MED ORDER — FENTANYL CITRATE (PF) 100 MCG/2ML IJ SOLN
INTRAMUSCULAR | Status: DC | PRN
  Administered 2015-02-08 (×5): 50 ug via INTRAVENOUS
  Administered 2015-02-08: 100 ug via INTRAVENOUS
  Administered 2015-02-08 (×3): 50 ug via INTRAVENOUS

## 2015-02-08 MED ORDER — OXYCODONE-ACETAMINOPHEN 5-325 MG PO TABS
1.0000 | ORAL_TABLET | ORAL | Status: DC | PRN
  Administered 2015-02-08: 2 via ORAL

## 2015-02-08 MED ORDER — DOCUSATE SODIUM 100 MG PO CAPS
100.0000 mg | ORAL_CAPSULE | Freq: Two times a day (BID) | ORAL | Status: DC
  Administered 2015-02-08 – 2015-02-09 (×2): 100 mg via ORAL
  Filled 2015-02-08 (×2): qty 1

## 2015-02-08 MED ORDER — PHENYLEPHRINE HCL 10 MG/ML IJ SOLN
10.0000 mg | INTRAVENOUS | Status: DC | PRN
  Administered 2015-02-08: 20 ug/min via INTRAVENOUS

## 2015-02-08 MED ORDER — ROCURONIUM BROMIDE 100 MG/10ML IV SOLN
INTRAVENOUS | Status: DC | PRN
  Administered 2015-02-08: 40 mg via INTRAVENOUS

## 2015-02-08 MED ORDER — DIAZEPAM 5 MG PO TABS
ORAL_TABLET | ORAL | Status: AC
  Filled 2015-02-08: qty 1

## 2015-02-08 MED ORDER — ACETAMINOPHEN 325 MG PO TABS: 650.0000 mg | ORAL_TABLET | ORAL | Status: DC | PRN

## 2015-02-08 MED ORDER — SODIUM CHLORIDE 0.9 % IR SOLN
Status: DC | PRN
  Administered 2015-02-08: 16:00:00

## 2015-02-08 MED ORDER — PHENYLEPHRINE HCL 10 MG/ML IJ SOLN
INTRAMUSCULAR | Status: DC | PRN
  Administered 2015-02-08: 80 ug via INTRAVENOUS
  Administered 2015-02-08 (×2): 40 ug via INTRAVENOUS
  Administered 2015-02-08: 80 ug via INTRAVENOUS

## 2015-02-08 MED ORDER — BACITRACIN ZINC 500 UNIT/GM EX OINT
TOPICAL_OINTMENT | CUTANEOUS | Status: DC | PRN
  Administered 2015-02-08 (×2): 1 via TOPICAL

## 2015-02-08 MED ORDER — BISACODYL 10 MG RE SUPP: 10.0000 mg | Freq: Every day | RECTAL | Status: DC | PRN

## 2015-02-08 MED ORDER — ACETAMINOPHEN 650 MG RE SUPP: 650.0000 mg | RECTAL | Status: DC | PRN

## 2015-02-08 MED ORDER — HYDROMORPHONE HCL 1 MG/ML IJ SOLN
INTRAMUSCULAR | Status: AC
  Administered 2015-02-08: 1 mg
  Filled 2015-02-08: qty 1

## 2015-02-08 MED ORDER — OXYCODONE-ACETAMINOPHEN 5-325 MG PO TABS
ORAL_TABLET | ORAL | Status: AC
  Filled 2015-02-08: qty 2

## 2015-02-08 MED ORDER — FLUTICASONE PROPIONATE 50 MCG/ACT NA SUSP: 2.0000 | Freq: Every day | NASAL | Status: DC | PRN

## 2015-02-08 MED ORDER — ONDANSETRON HCL 4 MG/2ML IJ SOLN
INTRAMUSCULAR | Status: AC
  Filled 2015-02-08: qty 2

## 2015-02-08 MED ORDER — 0.9 % SODIUM CHLORIDE (POUR BTL) OPTIME
TOPICAL | Status: DC | PRN
  Administered 2015-02-08: 1000 mL

## 2015-02-08 MED ORDER — PHENOL 1.4 % MT LIQD: 1.0000 | OROMUCOSAL | Status: DC | PRN

## 2015-02-08 MED ORDER — ONDANSETRON HCL 4 MG/2ML IJ SOLN: 4.0000 mg | INTRAMUSCULAR | Status: DC | PRN

## 2015-02-08 MED ORDER — THROMBIN 5000 UNITS EX SOLR
CUTANEOUS | Status: DC | PRN
  Administered 2015-02-08 (×2): 5000 [IU] via TOPICAL

## 2015-02-08 MED ORDER — PROPOFOL 10 MG/ML IV BOLUS
INTRAVENOUS | Status: DC | PRN
  Administered 2015-02-08: 100 mg via INTRAVENOUS
  Administered 2015-02-08: 50 mg via INTRAVENOUS
  Administered 2015-02-08: 250 mg via INTRAVENOUS

## 2015-02-08 MED ORDER — NEOSTIGMINE METHYLSULFATE 10 MG/10ML IV SOLN
INTRAVENOUS | Status: DC | PRN
  Administered 2015-02-08: 3 mg via INTRAVENOUS

## 2015-02-08 MED ORDER — HYDROMORPHONE HCL 1 MG/ML IJ SOLN
INTRAMUSCULAR | Status: AC
  Filled 2015-02-08: qty 1

## 2015-02-08 MED ORDER — GLYCOPYRROLATE 0.2 MG/ML IJ SOLN
INTRAMUSCULAR | Status: AC
  Filled 2015-02-08: qty 1

## 2015-02-08 MED ORDER — ONDANSETRON HCL 4 MG/2ML IJ SOLN
INTRAMUSCULAR | Status: DC | PRN
  Administered 2015-02-08 (×2): 4 mg via INTRAVENOUS

## 2015-02-08 MED ORDER — AMLODIPINE BESYLATE 10 MG PO TABS
10.0000 mg | ORAL_TABLET | Freq: Every day | ORAL | Status: DC
  Administered 2015-02-09: 10 mg via ORAL
  Filled 2015-02-08: qty 1

## 2015-02-08 MED ORDER — DEXTROSE 5 % IV SOLN: 10.0000 mg | INTRAVENOUS | Status: DC | PRN

## 2015-02-08 MED ORDER — HYDROMORPHONE HCL 1 MG/ML IJ SOLN
1.0000 mg | INTRAMUSCULAR | Status: DC | PRN
  Administered 2015-02-08 (×2): 1 mg via INTRAVENOUS

## 2015-02-08 MED ORDER — DIAZEPAM 5 MG PO TABS
5.0000 mg | ORAL_TABLET | Freq: Four times a day (QID) | ORAL | Status: DC | PRN
  Administered 2015-02-08 – 2015-02-09 (×2): 5 mg via ORAL
  Filled 2015-02-08 (×2): qty 1

## 2015-02-08 MED ORDER — PROPOFOL 10 MG/ML IV BOLUS
INTRAVENOUS | Status: AC
  Filled 2015-02-08: qty 20

## 2015-02-08 MED ORDER — MENTHOL 3 MG MT LOZG: 1.0000 | LOZENGE | OROMUCOSAL | Status: DC | PRN

## 2015-02-08 MED ORDER — LIDOCAINE HCL (CARDIAC) 20 MG/ML IV SOLN
INTRAVENOUS | Status: DC | PRN
  Administered 2015-02-08: 100 mg via INTRAVENOUS

## 2015-02-08 MED ORDER — GLYCOPYRROLATE 0.2 MG/ML IJ SOLN
INTRAMUSCULAR | Status: DC | PRN
  Administered 2015-02-08: .4 mg via INTRAVENOUS

## 2015-02-08 MED ORDER — LACTATED RINGERS IV SOLN
INTRAVENOUS | Status: DC
  Administered 2015-02-08 (×2): via INTRAVENOUS

## 2015-02-08 MED ORDER — HEMOSTATIC AGENTS (NO CHARGE) OPTIME
TOPICAL | Status: DC | PRN
  Administered 2015-02-08: 1 via TOPICAL

## 2015-02-08 MED ORDER — LACTATED RINGERS IV SOLN
Freq: Once | INTRAVENOUS | Status: AC
  Administered 2015-02-08: 13:00:00 via INTRAVENOUS

## 2015-02-08 MED ORDER — CEFAZOLIN SODIUM-DEXTROSE 2-3 GM-% IV SOLR
2.0000 g | Freq: Three times a day (TID) | INTRAVENOUS | Status: AC
  Administered 2015-02-08 – 2015-02-09 (×2): 2 g via INTRAVENOUS
  Filled 2015-02-08 (×2): qty 50

## 2015-02-08 MED ORDER — PHENYLEPHRINE HCL 10 MG/ML IJ SOLN
INTRAMUSCULAR | Status: AC
  Filled 2015-02-08: qty 1

## 2015-02-08 MED ORDER — NEOSTIGMINE METHYLSULFATE 10 MG/10ML IV SOLN
INTRAVENOUS | Status: AC
  Filled 2015-02-08: qty 1

## 2015-02-08 MED ORDER — LACTATED RINGERS IV SOLN
INTRAVENOUS | Status: DC | PRN
  Administered 2015-02-08 (×2): via INTRAVENOUS

## 2015-02-08 MED ORDER — HYDROCODONE-ACETAMINOPHEN 5-325 MG PO TABS
1.0000 | ORAL_TABLET | ORAL | Status: DC | PRN
  Administered 2015-02-08: 2 via ORAL
  Filled 2015-02-08: qty 2

## 2015-02-08 MED ORDER — OXYMORPHONE HCL ER 15 MG PO TB12: 30.0000 mg | ORAL_TABLET | Freq: Two times a day (BID) | ORAL | Status: DC

## 2015-02-08 MED ORDER — GABAPENTIN 600 MG PO TABS
600.0000 mg | ORAL_TABLET | Freq: Three times a day (TID) | ORAL | Status: DC
  Administered 2015-02-08 – 2015-02-09 (×2): 600 mg via ORAL
  Filled 2015-02-08 (×2): qty 1

## 2015-02-08 MED ORDER — ALUM & MAG HYDROXIDE-SIMETH 200-200-20 MG/5ML PO SUSP: 30.0000 mL | Freq: Four times a day (QID) | ORAL | Status: DC | PRN

## 2015-02-08 MED ORDER — PANTOPRAZOLE SODIUM 40 MG PO TBEC
40.0000 mg | DELAYED_RELEASE_TABLET | Freq: Every day | ORAL | Status: DC
  Administered 2015-02-08 – 2015-02-09 (×2): 40 mg via ORAL
  Filled 2015-02-08 (×2): qty 1

## 2015-02-08 SURGICAL SUPPLY — 60 items
BAG DECANTER FOR FLEXI CONT (MISCELLANEOUS) ×3 IMPLANT
BENZOIN TINCTURE PRP APPL 2/3 (GAUZE/BANDAGES/DRESSINGS) ×3 IMPLANT
BIT DRILL NEURO 2X3.1 SFT TUCH (MISCELLANEOUS) ×1 IMPLANT
BLADE CLIPPER SURG (BLADE) IMPLANT
BLADE ULTRA TIP 2M (BLADE) IMPLANT
BRUSH SCRUB EZ 1% IODOPHOR (MISCELLANEOUS) IMPLANT
BRUSH SCRUB EZ PLAIN DRY (MISCELLANEOUS) ×3 IMPLANT
CANISTER SUCT 3000ML PPV (MISCELLANEOUS) ×3 IMPLANT
CLOSURE WOUND 1/2 X4 (GAUZE/BANDAGES/DRESSINGS) ×1
CONT SPEC 4OZ CLIKSEAL STRL BL (MISCELLANEOUS) ×3 IMPLANT
DRAPE C-ARM 42X72 X-RAY (DRAPES) IMPLANT
DRAPE LAPAROTOMY 100X72 PEDS (DRAPES) ×3 IMPLANT
DRAPE LAPAROTOMY 100X72X124 (DRAPES) ×3 IMPLANT
DRAPE MICROSCOPE LEICA (MISCELLANEOUS) IMPLANT
DRAPE POUCH INSTRU U-SHP 10X18 (DRAPES) ×3 IMPLANT
DRAPE SURG 17X23 STRL (DRAPES) ×6 IMPLANT
DRILL NEURO 2X3.1 SOFT TOUCH (MISCELLANEOUS) ×3
ELECT BLADE 4.0 EZ CLEAN MEGAD (MISCELLANEOUS) ×3
ELECT REM PT RETURN 9FT ADLT (ELECTROSURGICAL) ×3
ELECTRODE BLDE 4.0 EZ CLN MEGD (MISCELLANEOUS) ×1 IMPLANT
ELECTRODE REM PT RTRN 9FT ADLT (ELECTROSURGICAL) ×1 IMPLANT
GAUZE SPONGE 4X4 12PLY STRL (GAUZE/BANDAGES/DRESSINGS) ×3 IMPLANT
GAUZE SPONGE 4X4 16PLY XRAY LF (GAUZE/BANDAGES/DRESSINGS) IMPLANT
GLOVE BIO SURGEON STRL SZ8 (GLOVE) ×3 IMPLANT
GLOVE BIO SURGEON STRL SZ8.5 (GLOVE) ×3 IMPLANT
GLOVE EXAM NITRILE LRG STRL (GLOVE) IMPLANT
GLOVE EXAM NITRILE MD LF STRL (GLOVE) IMPLANT
GLOVE EXAM NITRILE XL STR (GLOVE) IMPLANT
GLOVE EXAM NITRILE XS STR PU (GLOVE) IMPLANT
GOWN STRL REUS W/ TWL LRG LVL3 (GOWN DISPOSABLE) IMPLANT
GOWN STRL REUS W/ TWL XL LVL3 (GOWN DISPOSABLE) ×4 IMPLANT
GOWN STRL REUS W/TWL LRG LVL3 (GOWN DISPOSABLE)
GOWN STRL REUS W/TWL XL LVL3 (GOWN DISPOSABLE) ×8
KIT BASIN OR (CUSTOM PROCEDURE TRAY) ×3 IMPLANT
KIT ROOM TURNOVER OR (KITS) ×3 IMPLANT
NEEDLE HYPO 22GX1.5 SAFETY (NEEDLE) ×3 IMPLANT
NEEDLE SPNL 18GX3.5 QUINCKE PK (NEEDLE) IMPLANT
NS IRRIG 1000ML POUR BTL (IV SOLUTION) ×3 IMPLANT
PACK LAMINECTOMY NEURO (CUSTOM PROCEDURE TRAY) ×3 IMPLANT
PAD ARMBOARD 7.5X6 YLW CONV (MISCELLANEOUS) ×9 IMPLANT
PATTIES SURGICAL .25X.25 (GAUZE/BANDAGES/DRESSINGS) IMPLANT
PIN MAYFIELD SKULL DISP (PIN) ×3 IMPLANT
RUBBERBAND STERILE (MISCELLANEOUS) IMPLANT
SCRUB FOAM SURGICAL 12 800ML (MISCELLANEOUS) ×3 IMPLANT
SPONGE LAP 4X18 X RAY DECT (DISPOSABLE) IMPLANT
SPONGE NEURO XRAY DETECT 1X3 (DISPOSABLE) IMPLANT
SPONGE SURGIFOAM ABS GEL SZ50 (HEMOSTASIS) ×3 IMPLANT
STAPLER SKIN PROX WIDE 3.9 (STAPLE) IMPLANT
STRIP CLOSURE SKIN 1/2X4 (GAUZE/BANDAGES/DRESSINGS) ×2 IMPLANT
SUT ETHILON 2 0 FS 18 (SUTURE) IMPLANT
SUT VIC AB 0 CT1 18XCR BRD8 (SUTURE) ×1 IMPLANT
SUT VIC AB 0 CT1 8-18 (SUTURE) ×2
SUT VIC AB 2-0 CP2 18 (SUTURE) ×3 IMPLANT
SYR 20ML ECCENTRIC (SYRINGE) IMPLANT
TAPE CLOTH SURG 4X10 WHT LF (GAUZE/BANDAGES/DRESSINGS) ×3 IMPLANT
TOWEL OR 17X24 6PK STRL BLUE (TOWEL DISPOSABLE) ×3 IMPLANT
TOWEL OR 17X26 10 PK STRL BLUE (TOWEL DISPOSABLE) ×3 IMPLANT
TRAY FOLEY W/METER SILVER 14FR (SET/KITS/TRAYS/PACK) IMPLANT
UNDERPAD 30X30 INCONTINENT (UNDERPADS AND DIAPERS) IMPLANT
WATER STERILE IRR 1000ML POUR (IV SOLUTION) ×3 IMPLANT
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
## 2015-02-08 NOTE — OR Nursing (Signed)
Removed implants sent to workroom then given to Dr. [name]

## 2015-02-08 NOTE — Anesthesia Procedure Notes (Signed)
Procedure Name: Intubation Date/Time: 02/08/2015 3:44 PM Performed by: Merdis Delay Pre-anesthesia Checklist: Patient identified, Timeout performed, Emergency Drugs available, Suction available and Patient being monitored Patient Re-evaluated:Patient Re-evaluated prior to inductionOxygen Delivery Method: Circle system utilized Preoxygenation: Pre-oxygenation with 100% oxygen Intubation Type: IV induction Ventilation: Mask ventilation without difficulty and Oral airway inserted - appropriate to patient size Laryngoscope Size: Mac and 4 Grade View: Grade I Tube type: Oral Nasal Tubes: Right Tube size: 7.5 mm Number of attempts: 1 Airway Equipment and Method: Stylet and LTA kit utilized Placement Confirmation: ETT inserted through vocal cords under direct vision,  breath sounds checked- equal and bilateral,  positive ETCO2 and CO2 detector Secured at: 22 cm Tube secured with: Tape Dental Injury: Teeth and Oropharynx as per pre-operative assessment

## 2015-02-08 NOTE — H&P (Signed)
Subjective: The patient is a 44 year old white male who is a chronic pain after work-related injury. I performed a posterior thoracic instrumentation fusion after patient developed a pseudoarthrosis. He can use to have left-sided pain. A myelogram CT scan demonstrated the left T1 pedicle screw was a bit medial. I discussed the situation with the patient. He has decided to have the pedicle screw removed.   Past Medical History  Diagnosis Date  . Neck injury   . Hypertension     treated at one time, no medications at this time  . Bronchitis   . Headache     due to neck pain  . Arthritis   . GERD (gastroesophageal reflux disease)     Past Surgical History  Procedure Laterality Date  . Cervical spine surgery      x3  . Carpel tunnel Left     and elbow   . Anterior cervical decomp/discectomy fusion N/A 04/22/2013    Procedure: ANTERIOR CERVICAL DECOMPRESSION/DISCECTOMY FUSION 1 CERVICAL SEVEN-THORACIC ONELEVEL/HARDWARE REMOVAL;  Surgeon: [name], MD;  Location: [facility];  Service: Neurosurgery;  Laterality: N/A;  C7/T1 anterior cervical decompression with fusion interbody prothesis plating and bonegraft with removal of codman slim loc plate  . Elbow surgery Left     nerve release  . Wisdom tooth extraction    . Posterior cervical fusion/foraminotomy N/A 05/26/2014    Procedure: Cervical four-five, Cervical seven-Thoracic one Posterior Cervical Instrumentation and Fusion;  Surgeon: Tressie Stalker, MD;  Location: [facility];  Service: Neurosurgery;  Laterality: N/A;    No Known Allergies  History  Substance Use Topics  . Smoking status: Never Smoker   . Smokeless tobacco: Former Neurosurgeon    Types: Snuff    Quit date: 05/24/1992  . Alcohol Use: 3.6 oz/week    6 Cans of beer per week     Comment: occasional    Family History  Problem Relation Age of Onset  . Lung cancer Mother   . Hypertension Father    Prior to Admission medications   Medication Sig Start Date End  Date Taking? Authorizing Provider  amLODipine (NORVASC) 10 MG tablet TAKE 1 TABLET BY MOUTH EVERY DAY 01/26/15  Yes Anola Gurney, PA  diazepam (VALIUM) 5 MG tablet Take 1 tablet (5 mg total) by mouth every 6 (six) hours as needed for muscle spasms. 05/27/14  Yes Tressie Stalker, MD  docusate sodium (COLACE) 50 MG capsule Take 50 mg by mouth 2 (two) times daily.   Yes Historical Provider, MD  DULoxetine (CYMBALTA) 60 MG capsule Take 60 mg by mouth daily.   Yes Historical Provider, MD  fluticasone (FLONASE) 50 MCG/ACT nasal spray Place 2 sprays into both nostrils daily as needed. 01/31/15  Yes Historical Provider, MD  gabapentin (NEURONTIN) 600 MG tablet Take 600 mg by mouth 3 (three) times daily.   Yes Historical Provider, MD  omeprazole (PRILOSEC) 20 MG capsule Take 20 mg by mouth daily as needed (Heart burn).   Yes Historical Provider, MD  oxyCODONE (ROXICODONE) 15 MG immediate release tablet Take 2 tablets (30 mg total) by mouth every 4 (four) hours as needed for pain. 05/27/14  Yes Tressie Stalker, MD  oxymorphone (OPANA ER) 15 MG 12 hr tablet Take 2 tablets (30 mg total) by mouth every 12 (twelve) hours. 05/27/14   Tressie Stalker, MD     Review of Systems  Positive ROS: As above  All other systems have been reviewed and were otherwise negative with the exception of those  mentioned in the HPI and as above.  Objective: Vital signs in last 24 hours: Temp:  [97.7 F (36.5 C)] 97.7 F (36.5 C) (06/22 1227) Pulse Rate:  [93] 93 (06/22 1227) Resp:  [20] 20 (06/22 1227) BP: (162)/(99) 162/99 mmHg (06/22 1227) SpO2:  [98 %] 98 % (06/22 1227) Weight:  [103.42 kg (228 lb)] 103.42 kg (228 lb) (06/22 1227)  General Appearance: Alert, cooperative, no distress, Head: Normocephalic, without obvious abnormality, atraumatic Eyes: PERRL, conjunctiva/corneas clear, EOM's intact,    Ears: Normal  Throat: Normal  Neck: Supple, symmetrical, trachea midline, no adenopathy; thyroid: No  enlargement/tenderness/nodules; no carotid bruit or JVD. [name] anterior and posterior cervical incisions are well-healed. Back: Symmetric, no curvature, ROM normal, no CVA tenderness Lungs: Clear to auscultation bilaterally, respirations unlabored Heart: Regular rate and rhythm, no murmur, rub or gallop Abdomen: Soft, non-tender,, no masses, no organomegaly Extremities: Extremities normal, atraumatic, no cyanosis or edema Pulses: 2+ and symmetric all extremities Skin: Skin color, texture, turgor normal, no rashes or lesions  NEUROLOGIC:   Mental status: alert and oriented, no aphasia, good attention span, Fund of knowledge/ memory ok Motor Exam - grossly normal Sensory Exam - grossly normal Reflexes:  Coordination - grossly normal Gait - grossly normal Balance - grossly normal Cranial Nerves: I: smell Not tested  II: visual acuity  OS: Normal  OD: Normal   II: visual fields Full to confrontation  II: pupils Equal, round, reactive to light  III,VII: ptosis None  III,IV,VI: extraocular muscles  Full ROM  V: mastication Normal  V: facial light touch sensation  Normal  V,VII: corneal reflex  Present  VII: facial muscle function - upper  Normal  VII: facial muscle function - lower Normal  VIII: hearing Not tested  IX: soft palate elevation  Normal  IX,X: gag reflex Present  XI: trapezius strength  5/5  XI: sternocleidomastoid strength 5/5  XI: neck flexion strength  5/5  XII: tongue strength  Normal    Data Review Lab Results  Component Value Date   WBC 7.2 02/07/2015   HGB 14.0 02/07/2015   HCT 40.2 02/07/2015   MCV 85.2 02/07/2015   PLT 269 02/07/2015   Lab Results  Component Value Date   NA 137 02/07/2015   K 3.9 02/07/2015   CL 102 02/07/2015   CO2 27 02/07/2015   BUN 8 02/07/2015   CREATININE 0.59* 02/07/2015   GLUCOSE 116* 02/07/2015   Lab Results  Component Value Date   INR 0.8 12/09/2014    Assessment/Plan: Left T1 displaced pedicle screw: I  have discussed the situation with the patient. I have reviewed his imaging studies with him and pointed out the abnormality is. We have discussed the various treatment options including removal of the screw. I described the surgery to him. We have discussed the risks, benefits, alternatives, and likelihood of achieving our goals with surgery. I have answered all his questions. He has decided to proceed with surgery after weighing the risks, benefits, and alternatives.   Shaquala Broeker D 02/08/2015 3:07 PM

## 2015-02-08 NOTE — Plan of Care (Signed)
Problem: Consults Goal: Diagnosis - Spinal Surgery Outcome: Completed/Met Date Met:  02/08/15 Cervical Spine Fusion     

## 2015-02-08 NOTE — Progress Notes (Signed)
Pt arrived to 4N from PACU. Alert and oriented. Family at bedside. VSS. Safety measures in placed. No distress noted.  Bed in lowest position. Call light within reach.   Shella Spearing, Charity fundraiser.

## 2015-02-08 NOTE — Transfer of Care (Signed)
Immediate Anesthesia Transfer of Care Note  Patient: Javier Griffin  Procedure(s) Performed: Procedure(s) with comments: Removal of Thoracic one Pedicle Screw (N/A) - removal of T1 pedicle screw  Patient Location: PACU  Anesthesia Type:General  Level of Consciousness: sedated  Airway & Oxygen Therapy: Patient Spontanous Breathing and Patient connected to nasal cannula oxygen  Post-op Assessment: Report given to RN and Post -op Vital signs reviewed and stable  Post vital signs: Reviewed and stable  Last Vitals:  Filed Vitals:   02/08/15 1704  BP: 121/84  Pulse: 94  Temp:   Resp: 14    Complications: No apparent anesthesia complications

## 2015-02-08 NOTE — Progress Notes (Signed)
Subjective:  The patient is alert and pleasant. He looks well. He is appropriately sore.  Objective: Vital signs in last 24 hours: Temp:  [97.3 F (36.3 C)-97.7 F (36.5 C)] 97.3 F (36.3 C) (06/22 1659) Pulse Rate:  [89-107] 103 (06/22 1730) Resp:  [12-20] 18 (06/22 1730) BP: (121-162)/(64-99) 150/64 mmHg (06/22 1720) SpO2:  [94 %-98 %] 94 % (06/22 1730) Weight:  [103.42 kg (228 lb)] 103.42 kg (228 lb) (06/22 1227)  Intake/Output from previous day:   Intake/Output this shift: Total I/O In: 1400 [I.V.:1400] Out: 20 [Blood:20]  Physical exam the patient is alert and pleasant. He is moving all 4 extremities well.  Lab Results:  Recent Labs  02/07/15 0923  WBC 7.2  HGB 14.0  HCT 40.2  PLT 269   BMET  Recent Labs  02/07/15 0923  NA 137  K 3.9  CL 102  CO2 27  GLUCOSE 116*  BUN 8  CREATININE 0.59*  CALCIUM 9.2    Studies/Results: Dg Cervical Spine 2-3 Views  02/08/2015   CLINICAL DATA:  Hardware removal of T1 pedicle screw.  EXAM: CERVICAL SPINE - 2-3 VIEW  COMPARISON:  CT scan dated 12/09/2014  FINDINGS: PA and lateral portable images of the cervical spine demonstrate hardware C4-5 on the lateral view and hardware at C4-5 and at C7-T1 in the PA projection. Surgical fixation devices obscure the detail of the pedicle screws at C7.  IMPRESSION: C7 pedicle screws are only partially visualized on the available images. No screws appear to have been removed at the time of these images.   Electronically Signed   By: Francene Boyers M.D.   On: 02/08/2015 17:09    Assessment/Plan: The patient is doing well. I spoke with his wife.      Taveon Enyeart D 02/08/2015, 5:49 PM

## 2015-02-08 NOTE — Anesthesia Postprocedure Evaluation (Signed)
  Anesthesia Post-op Note  Patient: Javier Griffin  Procedure(s) Performed: Procedure(s) with comments: Removal of Thoracic one Pedicle Screw (N/A) - removal of T1 pedicle screw  Patient Location: PACU  Anesthesia Type:General  Level of Consciousness: awake, alert , oriented and patient cooperative  Airway and Oxygen Therapy: Patient Spontanous Breathing  Post-op Pain: mild, moderate  Post-op Assessment: Post-op Vital signs reviewed, Patient's Cardiovascular Status Stable, Respiratory Function Stable, Patent Airway, No signs of Nausea or vomiting and Pain level controlled              Post-op Vital Signs: stable  Last Vitals:  Filed Vitals:   02/08/15 1730  BP:   Pulse: 103  Temp:   Resp: 18    Complications: No apparent anesthesia complications

## 2015-02-08 NOTE — Anesthesia Preprocedure Evaluation (Signed)
Anesthesia Evaluation  Patient identified by MRN, date of birth, ID band Patient awake    Reviewed: Allergy & Precautions, NPO status , Patient's Chart, lab work & pertinent test results  Airway Mallampati: I       Dental   Pulmonary    Pulmonary exam normal       Cardiovascular hypertension, Normal cardiovascular exam    Neuro/Psych  Headaches,    GI/Hepatic GERD-  ,  Endo/Other    Renal/GU      Musculoskeletal  (+) Arthritis -,   Abdominal   Peds  Hematology   Anesthesia Other Findings   Reproductive/Obstetrics                             Anesthesia Physical Anesthesia Plan  ASA: II  Anesthesia Plan: General   Post-op Pain Management:    Induction: Intravenous  Airway Management Planned: Oral ETT  Additional Equipment:   Intra-op Plan:   Post-operative Plan: Extubation in OR  Informed Consent: I have reviewed the patients History and Physical, chart, labs and discussed the procedure including the risks, benefits and alternatives for the proposed anesthesia with the patient or authorized representative who has indicated his/her understanding and acceptance.     Plan Discussed with: CRNA, Anesthesiologist and Surgeon  Anesthesia Plan Comments:         Anesthesia Quick Evaluation

## 2015-02-08 NOTE — Progress Notes (Signed)
Pt still in a 10/10 pain after dilaudid, Dr Katrinka Blazing aware and order recieved

## 2015-02-09 ENCOUNTER — Encounter (HOSPITAL_COMMUNITY): Payer: Self-pay | Admitting: Neurosurgery

## 2015-02-09 DIAGNOSIS — T8489XA Other specified complication of internal orthopedic prosthetic devices, implants and grafts, initial encounter: Secondary | ICD-10-CM | POA: Diagnosis not present

## 2015-02-09 MED ORDER — CYCLOBENZAPRINE HCL 10 MG PO TABS: 10.0000 mg | ORAL_TABLET | Freq: Three times a day (TID) | ORAL | Status: DC | PRN

## 2015-02-09 MED ORDER — OXYCODONE HCL 5 MG PO TABS
15.0000 mg | ORAL_TABLET | ORAL | Status: DC | PRN
  Administered 2015-02-09: 15 mg via ORAL
  Filled 2015-02-09: qty 3

## 2015-02-09 MED ORDER — OXYCODONE-ACETAMINOPHEN 10-325 MG PO TABS: 1.0000 | ORAL_TABLET | ORAL | Status: DC | PRN

## 2015-02-09 NOTE — Progress Notes (Signed)
Pt discharged at this time alert, verbal taking all personal belongings. No noted distress. IV discontinued, dry dressing applied. Discharge instructions provided along with prescriptions with verbal understanding. Pt has a follow up appt scheduled. Surgical dressing changed, clean dry and intact.

## 2015-02-09 NOTE — Discharge Summary (Signed)
  Physician Discharge Summary  Patient ID: Javier Griffin MRN: [id number] DOB/AGE: 1971-06-18 44 y.o.  Admit date: 02/08/2015 Discharge date: 02/09/2015  Admission Diagnoses: Cervicalgia, displaced left T1 pedicle screw  Discharge Diagnoses: The same Active Problems:   Cervicalgia   Discharged Condition: good  Hospital Course: I performed a removal of the patient's left C7 and T1 screws on 02/08/2015. The surgery went well.  [name] postoperative course was unremarkable. On postoperative day #1 he requests discharge to home. The patient was given oral and written discharge instructions. All his questions were answered.  Consults: None Significant Diagnostic Studies: None Treatments: Removal of left C7 and T1 screws Discharge Exam: Blood pressure 120/82, pulse 89, temperature 98 F (36.7 C), temperature source Oral, resp. rate 16, height 6' (1.829 m), weight 104.599 kg (230 lb 9.6 oz), SpO2 99 %. The patient is alert and pleasant. His dressing has more or less fallen off. His wound is healing well. The Steri-Strips are in place. His strength is normal.  Disposition: Home     Medication List    STOP taking these medications        diazepam 5 MG tablet  Commonly known as:  VALIUM     oxyCODONE 15 MG immediate release tablet  Commonly known as:  ROXICODONE     oxymorphone 15 MG 12 hr tablet  Commonly known as:  OPANA ER      TAKE these medications        amLODipine 10 MG tablet  Commonly known as:  NORVASC  TAKE 1 TABLET BY MOUTH EVERY DAY     cyclobenzaprine 10 MG tablet  Commonly known as:  FLEXERIL  Take 1 tablet (10 mg total) by mouth 3 (three) times daily as needed for muscle spasms.     docusate sodium 50 MG capsule  Commonly known as:  COLACE  Take 50 mg by mouth 2 (two) times daily.     DULoxetine 60 MG capsule  Commonly known as:  CYMBALTA  Take 60 mg by mouth daily.     fluticasone 50 MCG/ACT nasal spray  Commonly known as:  FLONASE  Place 2  sprays into both nostrils daily as needed.     gabapentin 600 MG tablet  Commonly known as:  NEURONTIN  Take 600 mg by mouth 3 (three) times daily.     omeprazole 20 MG capsule  Commonly known as:  PRILOSEC  Take 20 mg by mouth daily as needed (Heart burn).     oxyCODONE-acetaminophen 10-325 MG per tablet  Commonly known as:  PERCOCET  Take 1 tablet by mouth every 4 (four) hours as needed for pain.         SignedCristi Loron 02/09/2015, 10:59 AM

## 2015-02-14 DIAGNOSIS — I10 Essential (primary) hypertension: Secondary | ICD-10-CM | POA: Insufficient documentation

## 2015-02-14 DIAGNOSIS — E782 Mixed hyperlipidemia: Secondary | ICD-10-CM | POA: Insufficient documentation

## 2015-02-14 DIAGNOSIS — K219 Gastro-esophageal reflux disease without esophagitis: Secondary | ICD-10-CM | POA: Insufficient documentation

## 2015-02-14 DIAGNOSIS — E785 Hyperlipidemia, unspecified: Secondary | ICD-10-CM | POA: Insufficient documentation

## 2015-02-15 ENCOUNTER — Encounter: Payer: Self-pay | Admitting: Family Medicine

## 2015-02-15 ENCOUNTER — Ambulatory Visit (INDEPENDENT_AMBULATORY_CARE_PROVIDER_SITE_OTHER): Payer: BLUE CROSS/BLUE SHIELD | Admitting: Family Medicine

## 2015-02-15 VITALS — BP 134/90 | HR 80 | Temp 98.6°F | Resp 16 | Ht 72.0 in | Wt 227.4 lb

## 2015-02-15 DIAGNOSIS — K219 Gastro-esophageal reflux disease without esophagitis: Secondary | ICD-10-CM

## 2015-02-15 DIAGNOSIS — M542 Cervicalgia: Secondary | ICD-10-CM | POA: Diagnosis not present

## 2015-02-15 DIAGNOSIS — I10 Essential (primary) hypertension: Secondary | ICD-10-CM

## 2015-02-15 MED ORDER — DIAZEPAM 5 MG PO TABS: 5.0000 mg | ORAL_TABLET | Freq: Two times a day (BID) | ORAL | Status: DC | PRN

## 2015-02-15 MED ORDER — LANSOPRAZOLE 30 MG PO CPDR: DELAYED_RELEASE_CAPSULE | ORAL | Status: DC

## 2015-02-15 NOTE — Patient Instructions (Signed)
Elevate legs at head of bed to 6 inches to help with reflux

## 2015-02-15 NOTE — Progress Notes (Signed)
Subjective:     Patient ID: Javier Griffin, male   DOB: 07/19/1971, 44 y.o.   MRN: [id number]  HPI  Chief Complaint  Patient presents with  . Gastrophageal Reflux    Patient is here today for follow up LOV 01/12/15 patient was given samples of Dexilant in office  . Hypertension    Patient is here for follow up up LOV 01/12/15, advised to continue Amlodipine Besylate 10mg  qd. Last b/p in office was 130/90  [facility] to remove loose hardware in his neck. He has further f/u in 4 weeks. Wishes diazepam to help with muscle spasms rather than flexeril. Reports using Dexilant as needed for reflux and it worked well for him. Has not elevated H.O.B.yet to help with nocturnal reflux.   Review of Systems  Cardiovascular:       Reports DBP elevation prior to surgery of 110. States he just took his bp medication prior to his visit today.       Objective:   Physical Exam  Constitutional: He appears well-developed and well-nourished. No distress.  Cardiovascular: Normal rate and regular rhythm.   Pulmonary/Chest: Breath sounds normal.       Assessment:    1. Essential hypertension  2. Gastroesophageal reflux disease, esophagitis presence not specified - lansoprazole (PREVACID) 30 MG capsule; Daily as needed for acid reflux  Dispense: 30 capsule; Refill: 5  3. Cervicalgia - diazepam (VALIUM) 5 MG tablet; Take 1 tablet (5 mg total) by mouth every 12 (twelve) hours as needed for muscle spasms.  Dispense: 60 tablet; Refill: 1    Plan:    Discussed H.O.B. Elevation Reevaluate bp in 4 weeks.

## 2015-02-21 ENCOUNTER — Other Ambulatory Visit: Payer: Self-pay | Admitting: Family Medicine

## 2015-03-06 ENCOUNTER — Other Ambulatory Visit: Payer: Self-pay | Admitting: Family Medicine

## 2015-03-06 DIAGNOSIS — I10 Essential (primary) hypertension: Secondary | ICD-10-CM

## 2015-03-06 MED ORDER — AMLODIPINE BESYLATE 10 MG PO TABS: 10.0000 mg | ORAL_TABLET | Freq: Every day | ORAL | Status: DC

## 2015-03-16 ENCOUNTER — Encounter: Payer: Self-pay | Admitting: Family Medicine

## 2015-03-16 ENCOUNTER — Ambulatory Visit (INDEPENDENT_AMBULATORY_CARE_PROVIDER_SITE_OTHER): Payer: BLUE CROSS/BLUE SHIELD | Admitting: Family Medicine

## 2015-03-16 VITALS — BP 136/78 | HR 84 | Temp 98.1°F | Resp 18 | Wt 231.0 lb

## 2015-03-16 DIAGNOSIS — E785 Hyperlipidemia, unspecified: Secondary | ICD-10-CM | POA: Diagnosis not present

## 2015-03-16 DIAGNOSIS — I10 Essential (primary) hypertension: Secondary | ICD-10-CM | POA: Diagnosis not present

## 2015-03-16 DIAGNOSIS — K219 Gastro-esophageal reflux disease without esophagitis: Secondary | ICD-10-CM

## 2015-03-16 NOTE — Progress Notes (Signed)
Subjective:     Patient ID: Javier Griffin, male   DOB: [birth year], 44 y.o.   MRN: [id number]  HPI  Chief Complaint  Patient presents with  . Hypertension    Patient is here for 4 weeks follow up. On his last OV B/P was 134/90  States he has recently seen his neurosurgeon and is anticipating getting back into pain management. His surgeon has told him further cervical surgery may be pending in the Fall. Has complied with twice daily use of diazepam.    Review of Systems  Respiratory: Negative for shortness of breath.   Cardiovascular: Negative for chest pain and palpitations.  Gastrointestinal:       Has elevated the left of his HOB with improvement in reflux though had breakthrough sx one night after eating spaghetti. Uses lansoprazole on an intermittent basis.       Objective:   Physical Exam  Constitutional: He appears well-developed and well-nourished. No distress.  Cardiovascular: Normal rate and regular rhythm.   Pulmonary/Chest: Breath sounds normal.  Musculoskeletal: He exhibits no edema (of lower extremities).       Assessment:    1. Essential hypertension: stable - Comprehensive metabolic panel  2. Gastroesophageal reflux disease, esophagitis presence not specified: improved  3. HLD (hyperlipidemia) - Lipid panel    Plan:    Encouraged return to pain management. Continue reflux measures.Further f/u pending lab work.

## 2015-03-27 ENCOUNTER — Other Ambulatory Visit: Payer: Self-pay | Admitting: Family Medicine

## 2015-04-04 ENCOUNTER — Other Ambulatory Visit: Payer: Self-pay | Admitting: Family Medicine

## 2015-04-04 DIAGNOSIS — K219 Gastro-esophageal reflux disease without esophagitis: Secondary | ICD-10-CM

## 2015-04-04 MED ORDER — LANSOPRAZOLE 30 MG PO CPDR: DELAYED_RELEASE_CAPSULE | ORAL | Status: DC

## 2015-04-07 ENCOUNTER — Other Ambulatory Visit: Payer: Self-pay | Admitting: Family Medicine

## 2015-04-14 ENCOUNTER — Other Ambulatory Visit: Payer: Self-pay | Admitting: Family Medicine

## 2015-04-14 ENCOUNTER — Other Ambulatory Visit: Payer: Self-pay

## 2015-04-14 NOTE — Telephone Encounter (Signed)
Left message to call back  

## 2015-04-14 NOTE — Telephone Encounter (Signed)
-----   Message from Anola Gurney, [place] sent at 04/14/2015  1:23 PM EDT ----- Let him know diazepam rx is available for pickup in the office.

## 2015-04-17 NOTE — Telephone Encounter (Signed)
Patient has been advised rx is available for pick up. KW

## 2015-04-21 ENCOUNTER — Other Ambulatory Visit: Payer: Self-pay | Admitting: Family Medicine

## 2015-04-21 ENCOUNTER — Telehealth: Payer: Self-pay | Admitting: Family Medicine

## 2015-04-21 NOTE — Telephone Encounter (Signed)
Wife relates he is taking a mix of medication for his neck pain including alcohol and otc pain relievers like Aleve PM. He is "talking out of his head" and appears oversedated. He is taking both flexeril and diazepam after requesting diazepam from me as it" worked better than flexeril". I have told his wife that I would cancel his refill for diazepam. She has spoken to his neurosurgeons about the other rx medications. I will confront him when he requests diazepam again. Wife also reports past hx of cocaine abuse though does not believe he is currently using. Paradoxically she reports they had a good week this past week when he was only using gabapentin and otc relievers with no alcohol.

## 2015-04-21 NOTE — Telephone Encounter (Signed)
Patients wife states that you have been prescribing Diazepam and she has discussed with you about patient abusing medication. She states that his neurosurgeon in [place] Dr. [name] has prescribed Flexeril, Neurontin and Hydrocodone for pain relief. She states on Monday he went to pharmacy and got all of these scripts filled, patient states that patient has been acting "out of his mind" with abusing prescriptions and drinking. Wife states that she called Dr. [name] and informed his office that he was abusing medications. She has concerns of interactions with these medications, she reports that patient is not reasonable and is having a hard time talking to him. She states that patient keeps saying that he is going to stop his habits but has not done so. Wife states that she is unable to get a hold of patients prescriptions because he has locked it in his gun safe. She would like for physcian to call back today on her cellphone ONLY at [phone].KW

## 2015-04-21 NOTE — Telephone Encounter (Signed)
Pt wife, Misty Stanley called to request a call back.  She states she needs to discuss the medication pt is taking.  Pt is taking way to much medication and talks out of his head most of the time and is not reasonable to deal with.   She states pt is also drinking alcohol with the medication. Pt is also getting medication from other doctors.   JY#[phone]/QM

## 2015-04-26 ENCOUNTER — Ambulatory Visit (INDEPENDENT_AMBULATORY_CARE_PROVIDER_SITE_OTHER): Payer: Non-veteran care | Admitting: Family Medicine

## 2015-04-26 ENCOUNTER — Encounter: Payer: Self-pay | Admitting: Family Medicine

## 2015-04-26 VITALS — BP 130/82 | HR 77 | Temp 98.1°F | Resp 16 | Wt 232.0 lb

## 2015-04-26 DIAGNOSIS — R609 Edema, unspecified: Secondary | ICD-10-CM

## 2015-04-26 DIAGNOSIS — I10 Essential (primary) hypertension: Secondary | ICD-10-CM | POA: Diagnosis not present

## 2015-04-26 DIAGNOSIS — M25473 Effusion, unspecified ankle: Secondary | ICD-10-CM

## 2015-04-26 MED ORDER — LISINOPRIL 10 MG PO TABS: 10.0000 mg | ORAL_TABLET | Freq: Every day | ORAL | Status: DC

## 2015-04-26 NOTE — Progress Notes (Signed)
Subjective:     Patient ID: Javier Griffin, male   DOB: 06-28-1971, 44 y.o.   MRN: [id number]  HPI  Chief Complaint  Patient presents with  . Swelling on ankles  States it has been bothering him for two weeks during the day. States it will go down at night. Reports compliance with amlodipine.   Review of Systems  Neurological:       Continues to have left neck and upper shoulder pain.F/u pending with neurosurgery in October. Confronted him about use of alcohol with his pain medication. Family is concerned about him being oversedated and misusing his medication. Told him I would not prescribe diazepam anymore and encouraged access to AA. He has been referred back to [facility] where he has been before.       Objective:   Physical Exam  Constitutional: He appears well-developed and well-nourished. No distress.  Cardiovascular: Normal rate and regular rhythm.   Pulmonary/Chest: Effort normal and breath sounds normal.  Musculoskeletal: He exhibits edema. Tenderness: 1+ of ankles.       Assessment:    1. Ankle edema: secondary to Ca++ blocker. Also will have him update labs ordered at last visit.  2. Essential hypertension - lisinopril (PRINIVIL,ZESTRIL) 10 MG tablet; Take 1 tablet (10 mg total) by mouth daily.  Dispense: 30 tablet; Refill: 0    Plan:    F/u in 4 weeks. Discussed concerns of his family about over use of medication and alcohol. I have d/c'ed diazepam refills.

## 2015-04-26 NOTE — Patient Instructions (Signed)
We will call you with lab results.       

## 2015-04-27 ENCOUNTER — Telehealth: Payer: Self-pay

## 2015-04-27 LAB — LIPID PANEL
Chol/HDL Ratio: 9.4 ratio units — ABNORMAL HIGH (ref 0.0–5.0)
Cholesterol, Total: 262 mg/dL — ABNORMAL HIGH (ref 100–199)
HDL: 28 mg/dL — AB (ref >39)
TRIGLYCERIDES: 625 mg/dL — AB (ref 0–149)

## 2015-04-27 LAB — COMPREHENSIVE METABOLIC PANEL
A/G RATIO: 1.6 (ref 1.1–2.5)
ALBUMIN: 4.7 g/dL (ref 3.5–5.5)
ALK PHOS: 70 IU/L (ref 39–117)
ALT: 41 IU/L (ref 0–44)
AST: 31 IU/L (ref 0–40)
BILIRUBIN TOTAL: 0.3 mg/dL (ref 0.0–1.2)
BUN/Creatinine Ratio: 14 (ref 9–20)
BUN: 13 mg/dL (ref 6–24)
CO2: 24 mmol/L (ref 18–29)
CREATININE: 0.91 mg/dL (ref 0.76–1.27)
Calcium: 10 mg/dL (ref 8.7–10.2)
Chloride: 97 mmol/L (ref 97–108)
GFR calc Af Amer: 120 mL/min/{1.73_m2} (ref >59)
GFR calc non Af Amer: 104 mL/min/{1.73_m2} (ref >59)
Globulin, Total: 3 g/dL (ref 1.5–4.5)
Glucose: 89 mg/dL (ref 65–99)
POTASSIUM: 4.4 mmol/L (ref 3.5–5.2)
SODIUM: 140 mmol/L (ref 134–144)
Total Protein: 7.7 g/dL (ref 6.0–8.5)

## 2015-04-27 NOTE — Telephone Encounter (Signed)
-----   Message from Anola Gurney, [place] sent at 04/27/2015  7:46 AM EDT ----- Please have [facility] add direct LDL to his current blood draw. Labs are ok pending further evaluation of his LDL cholesterol.

## 2015-04-27 NOTE — Telephone Encounter (Signed)
Spoke with representative from [facility] and had additional lab ordered, waiting on fax confirmation that there is sufficient specimen left to due testing.KW

## 2015-04-28 LAB — SPECIMEN STATUS REPORT

## 2015-04-28 LAB — LDL CHOLESTEROL, DIRECT: LDL Direct: 115 mg/dL — ABNORMAL HIGH (ref 0–99)

## 2015-04-28 NOTE — Telephone Encounter (Signed)
Patient has been advised of lab results. KW 

## 2015-04-28 NOTE — Telephone Encounter (Signed)
-----   Message from Anola Gurney, [place] sent at 04/28/2015  7:51 AM EDT ----- Calculated risk for developing cardiovascular disease over the next 10 years is 6.2%.We recommend cholesterol lowering medication when > 7.5%. Would recheck annually.

## 2015-05-16 ENCOUNTER — Other Ambulatory Visit: Payer: Self-pay | Admitting: Family Medicine

## 2015-05-19 ENCOUNTER — Encounter: Payer: Self-pay | Admitting: Physical Medicine & Rehabilitation

## 2015-05-24 ENCOUNTER — Ambulatory Visit: Payer: BLUE CROSS/BLUE SHIELD | Admitting: Family Medicine

## 2015-05-29 ENCOUNTER — Ambulatory Visit (INDEPENDENT_AMBULATORY_CARE_PROVIDER_SITE_OTHER): Payer: Non-veteran care | Admitting: Family Medicine

## 2015-05-29 ENCOUNTER — Encounter: Payer: Self-pay | Admitting: Family Medicine

## 2015-05-29 VITALS — BP 142/96 | HR 68 | Temp 98.9°F | Resp 16 | Wt 231.0 lb

## 2015-05-29 DIAGNOSIS — I10 Essential (primary) hypertension: Secondary | ICD-10-CM

## 2015-05-29 DIAGNOSIS — Z23 Encounter for immunization: Secondary | ICD-10-CM

## 2015-05-29 MED ORDER — LISINOPRIL 20 MG PO TABS: 20.0000 mg | ORAL_TABLET | Freq: Every day | ORAL | Status: DC

## 2015-05-29 NOTE — Patient Instructions (Signed)
Follow up with your neurosurgeon and the pain clinic as scheduled.

## 2015-05-29 NOTE — Progress Notes (Signed)
Subjective:     Patient ID: Javier Griffin, male   DOB: 06-18-1971, 44 y.o.   MRN: [id number]  HPI  Chief Complaint  Patient presents with  . Hypertension    Patient is present for 4 week follow up, last office visit on 9/7 patient was taken off Amlodipine and started on Lisinopril . Blood pressure last office visit was 130/82, patient states that blood pressure at home 130-150/90-100. Patient reports good compliance, tolerance and fair symptom control.   . Edema    Follow up from 04/26/15, patient was seen in office for ankle edema at visit we switched patients blood pressure medication. Patient states since stopping Amlodipine and starting Lisionopril symptoms have resolved.   Reports he has f/u with neurosurgery tomorrow and the pain clinic 10/28. Currently on hydrocodone 10 mg. twice daily and nsaid's.   Review of Systems  Respiratory: Negative for cough.        Objective:   Physical Exam  Constitutional: He appears well-developed and well-nourished. No distress.  Cardiovascular: Normal rate and regular rhythm.   Pulmonary/Chest: Breath sounds normal.  Musculoskeletal: He exhibits no edema (of lower extremities).       Assessment:    1. Essential hypertension: Increase lisinopril to 20 mg. daily  2. Need for influenza vaccination - Flu Vaccine QUAD 36+ mos IM    Plan:    F/u in 4 weeks. F/u with neurosurgery and pain clinic as scheduled.

## 2015-05-29 NOTE — Addendum Note (Signed)
Addended by: Jaclyn Prime on: 05/29/2015 11:15 AM   Modules accepted: SmartSet

## 2015-06-16 ENCOUNTER — Encounter: Payer: BLUE CROSS/BLUE SHIELD | Attending: Physical Medicine & Rehabilitation

## 2015-06-16 ENCOUNTER — Encounter: Payer: Self-pay | Admitting: Physical Medicine & Rehabilitation

## 2015-06-16 ENCOUNTER — Ambulatory Visit: Payer: Worker's Compensation | Admitting: Physical Medicine & Rehabilitation

## 2015-06-16 VITALS — BP 168/115 | HR 94 | Resp 14

## 2015-06-16 DIAGNOSIS — M961 Postlaminectomy syndrome, not elsewhere classified: Secondary | ICD-10-CM | POA: Insufficient documentation

## 2015-06-16 DIAGNOSIS — M5412 Radiculopathy, cervical region: Secondary | ICD-10-CM
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
## 2015-06-17 ENCOUNTER — Other Ambulatory Visit: Payer: Self-pay | Admitting: Family Medicine

## 2015-06-19 ENCOUNTER — Ambulatory Visit: Payer: BLUE CROSS/BLUE SHIELD | Admitting: Family Medicine

## 2015-06-20 ENCOUNTER — Ambulatory Visit (INDEPENDENT_AMBULATORY_CARE_PROVIDER_SITE_OTHER): Payer: BLUE CROSS/BLUE SHIELD | Admitting: Family Medicine

## 2015-06-20 ENCOUNTER — Other Ambulatory Visit: Payer: Self-pay | Admitting: Family Medicine

## 2015-06-20 ENCOUNTER — Encounter: Payer: Self-pay | Admitting: Family Medicine

## 2015-06-20 VITALS — BP 130/104 | HR 120 | Temp 98.6°F | Resp 16 | Ht 72.0 in | Wt 230.4 lb

## 2015-06-20 DIAGNOSIS — I10 Essential (primary) hypertension: Secondary | ICD-10-CM | POA: Diagnosis not present

## 2015-06-20 MED ORDER — LOSARTAN POTASSIUM 50 MG PO TABS: 50.0000 mg | ORAL_TABLET | Freq: Every day | ORAL | Status: DC

## 2015-06-20 NOTE — Progress Notes (Signed)
Subjective:     Patient ID: Javier Griffin, male   DOB: 11/25/1970, 44 y.o.   MRN: [id number]  HPI  Chief Complaint  Patient presents with  . Neck Pain    Patient is present in office today to follow up in regards to neck pain and numbness on his left side. Patient describes pain as sharp, he was recently seen at pain management clinc in [place] who reccomended patient see a Spine specialist in [place]. Patient reports he is currently taking Hydrocodone TID and Advil/Tylenol PRN  . Hypertension    Patient would like to address elevated blood pressure, patient reports blood pressure readings at home have been above 140/110  States he has developed a dry cough in the absence of cold symptoms over the last two weeks.   Review of Systems  Neurological:       Had recent independent medical evaluation re: neck pain and was recommended to [facility] to access other modalities of treatment. He has f/u with his neurosurgeon in January.       Objective:   Physical Exam  Constitutional: He appears well-developed and well-nourished. No distress.  Cardiovascular: Normal rate and regular rhythm.   Ears: T.M's intact without inflammation Throat: no tonsillar enlargement or exudate Neck: no cervical adenopathy Lungs: clear     Assessment:    1. Essential hypertension: will change to ARB - losartan (COZAAR) 50 MG tablet; Take 1 tablet (50 mg total) by mouth daily.  Dispense: 30 tablet; Refill: 0    Plan:    f/u in two weeks. Proceed with [facility].

## 2015-06-20 NOTE — Patient Instructions (Signed)
Let me know ahead of time if you can't tolerate the new medication.

## 2015-06-21 ENCOUNTER — Other Ambulatory Visit: Payer: Self-pay | Admitting: Family Medicine

## 2015-06-26 ENCOUNTER — Ambulatory Visit: Payer: BLUE CROSS/BLUE SHIELD | Admitting: Family Medicine

## 2015-07-04 ENCOUNTER — Encounter: Payer: Self-pay | Admitting: Family Medicine

## 2015-07-04 ENCOUNTER — Ambulatory Visit (INDEPENDENT_AMBULATORY_CARE_PROVIDER_SITE_OTHER): Payer: BLUE CROSS/BLUE SHIELD | Admitting: Family Medicine

## 2015-07-04 VITALS — BP 132/100 | HR 86 | Temp 98.8°F | Resp 16 | Wt 232.6 lb

## 2015-07-04 DIAGNOSIS — I10 Essential (primary) hypertension: Secondary | ICD-10-CM | POA: Diagnosis not present

## 2015-07-04 MED ORDER — LOSARTAN POTASSIUM 100 MG PO TABS: 100.0000 mg | ORAL_TABLET | Freq: Every day | ORAL | Status: DC

## 2015-07-04 NOTE — Progress Notes (Signed)
Subjective:     Patient ID: Javier Griffin, male   DOB: 12/22/1970, 44 y.o.   MRN: [id number]  HPI  Chief Complaint  Patient presents with  . Hypertension    Patient is present for follow up today lov 07/05/15, patient was started on Losartan 50mg . Patient reports good compliance and tolerance of medication, he states that he has not been checking his blood pressure outside the office  States he is pending referral to [facility]. Reports cough has improved since stopping lisinopril.   Review of Systems     Objective:   Physical Exam  Constitutional: He appears well-developed and well-nourished. No distress.  Cardiovascular: Normal rate and regular rhythm.   Pulmonary/Chest: Breath sounds normal.  Musculoskeletal: He exhibits no edema (of lower extremities).       Assessment:    1. Essential hypertension: increase dose as bp still high - losartan (COZAAR) 100 MG tablet; Take 1 tablet (100 mg total) by mouth daily.  Dispense: 30 tablet; Refill: 0    Plan:    Return in 3 weeks.

## 2015-07-25 ENCOUNTER — Encounter: Payer: Self-pay | Admitting: Family Medicine

## 2015-07-25 ENCOUNTER — Ambulatory Visit (INDEPENDENT_AMBULATORY_CARE_PROVIDER_SITE_OTHER): Payer: BLUE CROSS/BLUE SHIELD | Admitting: Family Medicine

## 2015-07-25 VITALS — BP 112/88 | HR 89 | Temp 98.7°F | Resp 16 | Wt 239.4 lb

## 2015-07-25 DIAGNOSIS — M542 Cervicalgia: Secondary | ICD-10-CM

## 2015-07-25 DIAGNOSIS — I10 Essential (primary) hypertension: Secondary | ICD-10-CM | POA: Diagnosis not present

## 2015-07-25 DIAGNOSIS — M961 Postlaminectomy syndrome, not elsewhere classified: Secondary | ICD-10-CM | POA: Diagnosis not present

## 2015-07-25 MED ORDER — PREGABALIN 50 MG PO CAPS: 50.0000 mg | ORAL_CAPSULE | Freq: Three times a day (TID) | ORAL | Status: DC

## 2015-07-25 MED ORDER — LOSARTAN POTASSIUM 100 MG PO TABS: 100.0000 mg | ORAL_TABLET | Freq: Every day | ORAL | Status: DC

## 2015-07-25 NOTE — Progress Notes (Signed)
Subjective:     Patient ID: Javier Griffin, male   DOB: 04/11/1971, 44 y.o.   MRN: [id number]  HPI Chief Complaint  Patient presents with  . Follow-up    Patient is present for a 3 week follow up today, last office visit patient was seen for Hypertension and prescription for Losartan 50mg  was increased to 100mg . Blood pressure last visit ws 132/100, patient reports good compliance and tolerance on medication  Also states he will not be getting in to the [facility] until January. States his neurosurgeon will no longer prescribe narcotic medication for him (last rx in mid-November). Currently taking 4-5 Advil every 6 hours and 3000 mg.of Tylenol in addition to gabapentin. Reports alcohol consumption is a 6 pack on the weekend when he is watching a ball game.  Review of Systems     Objective:   Physical Exam  Constitutional: He appears well-developed and well-nourished. No distress.  Cardiovascular: Normal rate and regular rhythm.   Pulmonary/Chest: Breath sounds normal.       Assessment:    1. Essential hypertension - losartan (COZAAR) 100 MG tablet; Take 1 tablet (100 mg total) by mouth daily.  Dispense: 30 tablet; Refill: 5  2. Cervicalgia - pregabalin (LYRICA) 50 MG capsule; Take 1 capsule (50 mg total) by mouth 3 (three) times daily.  Dispense: 90 capsule; Refill: 0  3. Cervical post-laminectomy syndrome - pregabalin (LYRICA) 50 MG capsule; Take 1 capsule (50 mg total) by mouth 3 (three) times daily.  Dispense: 90 capsule; Refill: 0    Plan:    Discussed rational use of nsaid's and Tylenol. Phone f/u in 2 weeks regarding Lyrica.

## 2015-07-25 NOTE — Patient Instructions (Signed)
For pain take up to four Advil every 6 hours. May use Extra-Strength Tylenol up to 3000 mg/day.

## 2015-08-01 ENCOUNTER — Other Ambulatory Visit: Payer: Self-pay | Admitting: Family Medicine

## 2015-08-08 ENCOUNTER — Telehealth: Payer: Self-pay | Admitting: Family Medicine

## 2015-08-08 ENCOUNTER — Other Ambulatory Visit: Payer: Self-pay | Admitting: Family Medicine

## 2015-08-08 NOTE — Telephone Encounter (Signed)
Too early to refill as I gave him a month's supply.

## 2015-08-08 NOTE — Telephone Encounter (Signed)
Patient was advised. KW 

## 2015-08-08 NOTE — Telephone Encounter (Signed)
Please advise. KW 

## 2015-08-08 NOTE — Telephone Encounter (Signed)
Pt called saying the lyrica works and would like you to go ahead and prescribe him a full prescription.  Also stated if there is anything else he can take a long with it to help the pain.  [facility]   Thanks,  Fortune Brandsteri

## 2015-08-16 ENCOUNTER — Other Ambulatory Visit: Payer: Self-pay | Admitting: Family Medicine

## 2015-08-16 NOTE — Telephone Encounter (Signed)
Pt contacted office for refill request on the following medications:  cyclobenzaprine (FLEXERIL) 10 MG tablet.  [facility] [address]  [phone] or [phone]/MW

## 2015-08-17 ENCOUNTER — Other Ambulatory Visit: Payer: Self-pay | Admitting: Family Medicine

## 2015-08-17 DIAGNOSIS — M961 Postlaminectomy syndrome, not elsewhere classified: Secondary | ICD-10-CM

## 2015-08-17 MED ORDER — CYCLOBENZAPRINE HCL 10 MG PO TABS: 10.0000 mg | ORAL_TABLET | Freq: Three times a day (TID) | ORAL | Status: DC | PRN

## 2015-08-17 NOTE — Telephone Encounter (Signed)
[facility] Left patient a voicemail advising him that RX has been called in.

## 2015-08-17 NOTE — Telephone Encounter (Signed)
Please call in as updated in the EMR with specific instructions for use.

## 2015-08-22 ENCOUNTER — Other Ambulatory Visit: Payer: Self-pay | Admitting: Family Medicine

## 2015-08-22 NOTE — Telephone Encounter (Signed)
Pres[facility] has been [facility] [address] KW 

## 2015-08-23 ENCOUNTER — Other Ambulatory Visit: Payer: Self-pay | Admitting: Family Medicine

## 2015-08-30 ENCOUNTER — Other Ambulatory Visit: Payer: Self-pay | Admitting: Family Medicine

## 2015-08-30 DIAGNOSIS — I10 Essential (primary) hypertension: Secondary | ICD-10-CM

## 2015-08-30 MED ORDER — LOSARTAN POTASSIUM 100 MG PO TABS: 100.0000 mg | ORAL_TABLET | Freq: Every day | ORAL | Status: DC

## 2015-09-07 ENCOUNTER — Other Ambulatory Visit: Payer: Self-pay | Admitting: Family Medicine

## 2015-09-08 ENCOUNTER — Other Ambulatory Visit: Payer: Self-pay | Admitting: Family Medicine

## 2015-09-08 DIAGNOSIS — I10 Essential (primary) hypertension: Secondary | ICD-10-CM

## 2015-09-08 MED ORDER — LOSARTAN POTASSIUM 100 MG PO TABS: 100.0000 mg | ORAL_TABLET | Freq: Every day | ORAL | Status: DC

## 2015-09-18 ENCOUNTER — Other Ambulatory Visit: Payer: Self-pay | Admitting: Family Medicine

## 2015-09-19 ENCOUNTER — Other Ambulatory Visit: Payer: Self-pay | Admitting: Family Medicine

## 2015-09-20 ENCOUNTER — Other Ambulatory Visit: Payer: Self-pay | Admitting: Family Medicine

## 2015-09-20 ENCOUNTER — Telehealth: Payer: Self-pay | Admitting: Family Medicine

## 2015-09-20 NOTE — Telephone Encounter (Signed)
Please advise. Thanks.  

## 2015-09-20 NOTE — Telephone Encounter (Signed)
Pt states he is still having neck and back pain.  Pt states he is taking over the counter Tylenol and Advil but it is not helping.  Pt is requesting a Rx to help with the pain.  [facility]  [phone]

## 2015-09-21 NOTE — Telephone Encounter (Signed)
[facility], patient notified.Patient states that he was awaiting call for MRI and still waiting to here back from pain clinic. He states that he has been taking Tylenol for pain and that it hasnt helped at all. Patient reports pain in his back and thigh that is radiating to his groin. He was advised to request medication from his PCP till MRI is done. Please advise. KW

## 2015-09-21 NOTE — Telephone Encounter (Signed)
Lyrica should help his pain. As I have told him at past visits I will not be prescribing narcotics.

## 2015-09-21 NOTE — Telephone Encounter (Signed)
I have refilled Flexeril and Lyrica today. Please call in  Lyrica as updated in the EMR.

## 2015-09-21 NOTE — Telephone Encounter (Signed)
Patient was advised as below. KW 

## 2015-10-11 ENCOUNTER — Ambulatory Visit (INDEPENDENT_AMBULATORY_CARE_PROVIDER_SITE_OTHER): Payer: BLUE CROSS/BLUE SHIELD | Admitting: Family Medicine

## 2015-10-11 ENCOUNTER — Encounter: Payer: Self-pay | Admitting: Family Medicine

## 2015-10-11 VITALS — BP 138/98 | HR 64 | Temp 98.2°F | Resp 14 | Wt 242.2 lb

## 2015-10-11 DIAGNOSIS — M542 Cervicalgia: Secondary | ICD-10-CM

## 2015-10-11 DIAGNOSIS — M961 Postlaminectomy syndrome, not elsewhere classified: Secondary | ICD-10-CM | POA: Diagnosis not present

## 2015-10-11 DIAGNOSIS — J301 Allergic rhinitis due to pollen: Secondary | ICD-10-CM | POA: Diagnosis not present

## 2015-10-11 DIAGNOSIS — I10 Essential (primary) hypertension: Secondary | ICD-10-CM | POA: Diagnosis not present

## 2015-10-11 DIAGNOSIS — J309 Allergic rhinitis, unspecified: Secondary | ICD-10-CM | POA: Insufficient documentation

## 2015-10-11 MED ORDER — HYDROCHLOROTHIAZIDE 25 MG PO TABS: 25.0000 mg | ORAL_TABLET | Freq: Every day | ORAL | Status: DC

## 2015-10-11 MED ORDER — PREGABALIN 100 MG PO CAPS: 100.0000 mg | ORAL_CAPSULE | Freq: Three times a day (TID) | ORAL | Status: DC

## 2015-10-11 MED ORDER — FLUTICASONE PROPIONATE 50 MCG/ACT NA SUSP: 2.0000 | Freq: Every day | NASAL | Status: DC

## 2015-10-11 NOTE — Progress Notes (Signed)
Subjective:     Patient ID: Javier Griffin, male   DOB: October 09, 1970, 45 y.o.   MRN: [id number]  HPI  Chief Complaint  Patient presents with  . Hypertension    Patient reports his BP was elevated at 160's/120's yesterday at Dr. [name] office. Dr. [name] advised him follow up herew today. Patient reports good complaince with treatment.   . Follow-up  Reports compliance with losartan.States he has gotten a few bp elevations of 150/100 at home as well. Also states present dose of Lyrica not helping with neck paresthesias/burning. He is still pending acceptance to [facility].   Review of Systems     Objective:   Physical Exam  Constitutional: He appears well-developed and well-nourished. No distress.  Cardiovascular: Normal rate and regular rhythm.   Pulmonary/Chest: Breath sounds normal.  Musculoskeletal:  No lower extremity edema.       Assessment:    1. Cervical post-laminectomy syndrome - pregabalin (LYRICA) 100 MG capsule; Take 1 capsule (100 mg total) by mouth 3 (three) times daily.  Dispense: 90 capsule; Refill: 5 - Ambulatory referral to Pain Clinic  2. Cervicalgia - pregabalin (LYRICA) 100 MG capsule; Take 1 capsule (100 mg total) by mouth 3 (three) times daily.  Dispense: 90 capsule; Refill: 5 - Ambulatory referral to Pain Clinic-we will check on prior referral status  3. Essential hypertension: continue Losartan - hydrochlorothiazide (HYDRODIURIL) 25 MG tablet; Take 1 tablet (25 mg total) by mouth daily.  Dispense: 90 tablet; Refill: 3  4. Allergic rhinitis due to polle - fluticasone (FLONASE) 50 MCG/ACT nasal spray; Place 2 sprays into both nostrils daily.  Dispense: 16 g; Refill: 5    Plan:    Recheck bp in 4 weeks.

## 2015-10-11 NOTE — Patient Instructions (Addendum)
We will call about status of your pain management referral. Don't exceed 3000-4000 mg/ day of Tylenol.

## 2015-10-19 ENCOUNTER — Other Ambulatory Visit: Payer: Self-pay | Admitting: Family Medicine

## 2015-11-08 ENCOUNTER — Ambulatory Visit (INDEPENDENT_AMBULATORY_CARE_PROVIDER_SITE_OTHER): Payer: BLUE CROSS/BLUE SHIELD | Admitting: Family Medicine

## 2015-11-08 ENCOUNTER — Ambulatory Visit: Payer: BLUE CROSS/BLUE SHIELD | Admitting: Family Medicine

## 2015-11-08 ENCOUNTER — Encounter: Payer: Self-pay | Admitting: Family Medicine

## 2015-11-08 VITALS — BP 150/94 | HR 60 | Temp 98.5°F | Resp 16 | Wt 248.2 lb

## 2015-11-08 DIAGNOSIS — M961 Postlaminectomy syndrome, not elsewhere classified: Secondary | ICD-10-CM

## 2015-11-08 DIAGNOSIS — M542 Cervicalgia: Secondary | ICD-10-CM

## 2015-11-08 DIAGNOSIS — I10 Essential (primary) hypertension: Secondary | ICD-10-CM

## 2015-11-08 DIAGNOSIS — M5412 Radiculopathy, cervical region: Secondary | ICD-10-CM | POA: Diagnosis not present

## 2015-11-08 MED ORDER — PREGABALIN 100 MG PO CAPS: 100.0000 mg | ORAL_CAPSULE | Freq: Three times a day (TID) | ORAL | Status: DC

## 2015-11-08 NOTE — Progress Notes (Signed)
Subjective:     Patient ID: Javier Griffin, male   DOB: 10/29/1970, 45 y.o.   MRN: [id number]  HPI  Chief Complaint  Patient presents with  . Hypertension    Patient is present in office today for follow up, last office visit was 10/11/15 blood pressure in house at visit was 138/98. At last office visit patient was advised to continue Losartan and start HCTZ 25mg , patient reports good compliance, tolerance and symptom control. Patient blood pressure reading at home for systolic has been between 120-130, and diastolic readings 74-85.  States he had two cups of coffee before his visit today and his neck pain is bothering him. Modest improvement with Lyrica with burning pain improved. Continues to have shooting pain in his neck and is still pending evaluation by [facility].   Review of Systems     Objective:   Physical Exam  Constitutional: He appears well-developed and well-nourished. No distress.  Cardiovascular: Normal rate and regular rhythm.   Pulmonary/Chest: Breath sounds normal.       Assessment:    1. Chronic cervical radiculopathy  2. Essential hypertension  3. Cervical post-laminectomy syndrome - pregabalin (LYRICA) 100 MG capsule; Take 1 capsule (100 mg total) by mouth 3 (three) times daily.  Dispense: 90 capsule; Refill: 5      Plan:    Nurse bp check in two weeks with home bp cuff comparison.

## 2015-11-13 ENCOUNTER — Other Ambulatory Visit: Payer: Self-pay | Admitting: Family Medicine

## 2015-11-13 DIAGNOSIS — M5412 Radiculopathy, cervical region: Secondary | ICD-10-CM

## 2015-11-14 ENCOUNTER — Other Ambulatory Visit: Payer: Self-pay | Admitting: Family Medicine

## 2015-11-15 ENCOUNTER — Other Ambulatory Visit: Payer: Self-pay | Admitting: Family Medicine

## 2015-11-16 ENCOUNTER — Other Ambulatory Visit: Payer: Self-pay | Admitting: Neurosurgery

## 2015-11-16 ENCOUNTER — Telehealth: Payer: Self-pay | Admitting: Family Medicine

## 2015-11-16 DIAGNOSIS — G8929 Other chronic pain: Secondary | ICD-10-CM

## 2015-11-16 DIAGNOSIS — M542 Cervicalgia: Principal | ICD-10-CM

## 2015-11-21 ENCOUNTER — Ambulatory Visit
Admission: RE | Admit: 2015-11-21 | Discharge: 2015-11-21 | Disposition: A | Discharge status: Home / Self Care | Payer: Worker's Compensation | Source: Ambulatory Visit | Attending: Neurosurgery | Admitting: Neurosurgery

## 2015-11-21 VITALS — BP 130/82 | HR 98

## 2015-11-21 DIAGNOSIS — G8929 Other chronic pain: Secondary | ICD-10-CM

## 2015-11-21 DIAGNOSIS — M5412 Radiculopathy, cervical region: Secondary | ICD-10-CM

## 2015-11-21 DIAGNOSIS — M961 Postlaminectomy syndrome, not elsewhere classified: Secondary | ICD-10-CM

## 2015-11-21 DIAGNOSIS — M542 Cervicalgia: Secondary | ICD-10-CM

## 2015-11-21 MED ORDER — ONDANSETRON HCL 4 MG/2ML IJ SOLN
4.0000 mg | Freq: Once | INTRAMUSCULAR | Status: AC
  Administered 2015-11-21: 4 mg via INTRAMUSCULAR

## 2015-11-21 MED ORDER — OXYCODONE-ACETAMINOPHEN 5-325 MG PO TABS
2.0000 | ORAL_TABLET | Freq: Once | ORAL | Status: AC
  Administered 2015-11-21: 2 via ORAL

## 2015-11-21 MED ORDER — MEPERIDINE HCL 100 MG/ML IJ SOLN
100.0000 mg | Freq: Once | INTRAMUSCULAR | Status: AC
  Administered 2015-11-21: 100 mg via INTRAMUSCULAR

## 2015-11-21 MED ORDER — DIAZEPAM 5 MG PO TABS
10.0000 mg | ORAL_TABLET | Freq: Once | ORAL | Status: AC
  Administered 2015-11-21: 10 mg via ORAL

## 2015-11-21 MED ORDER — IOHEXOL 300 MG/ML  SOLN
10.0000 mL | Freq: Once | INTRAMUSCULAR | Status: AC | PRN
  Administered 2015-11-21: 10 mL via INTRATHECAL

## 2015-11-21 NOTE — Discharge Instructions (Signed)

## 2015-11-23 ENCOUNTER — Encounter: Payer: Self-pay | Admitting: Family Medicine

## 2015-11-23 ENCOUNTER — Ambulatory Visit (INDEPENDENT_AMBULATORY_CARE_PROVIDER_SITE_OTHER): Payer: BLUE CROSS/BLUE SHIELD | Admitting: Family Medicine

## 2015-11-23 VITALS — BP 132/108 | HR 70 | Temp 98.9°F | Resp 16 | Wt 249.6 lb

## 2015-11-23 DIAGNOSIS — M5412 Radiculopathy, cervical region: Secondary | ICD-10-CM | POA: Diagnosis not present

## 2015-11-23 DIAGNOSIS — L259 Unspecified contact dermatitis, unspecified cause: Secondary | ICD-10-CM

## 2015-11-23 DIAGNOSIS — I10 Essential (primary) hypertension: Secondary | ICD-10-CM

## 2015-11-23 MED ORDER — FLUOCINONIDE 0.05 % EX CREA: 1.0000 "application " | TOPICAL_CREAM | Freq: Three times a day (TID) | CUTANEOUS | Status: DC

## 2015-11-23 MED ORDER — METOPROLOL SUCCINATE ER 50 MG PO TB24: 50.0000 mg | ORAL_TABLET | Freq: Every day | ORAL | Status: DC

## 2015-11-23 MED ORDER — PIROXICAM 20 MG PO CAPS: 20.0000 mg | ORAL_CAPSULE | Freq: Every day | ORAL | Status: DC

## 2015-11-23 NOTE — Progress Notes (Signed)
Subjective:     Patient ID: Javier Griffin, male   DOB: 06/23/1971, 45 y.o.   MRN: [id number]  HPI  Chief Complaint  Patient presents with  . Hypertension    Patient is present in office today for his 2 week follow up. Last office visit 11/08/15 patients blood pressure in office was 150/94. Patient reports that systolic 110-140 and diastolic ranging from 90-100  . Rash    Patient has rash that has appeared on 11/20/15, he states that he was riding tractor in woods with son and believes that he was in contact with poison ivy.  . Neck Pain    Patient returns for follow up from 11/08/15 he reports that he is compliant with taking Lyrica TID. Patient states that he had a milogram done 2 days ago and would like to review over results. Patient states that [facility] but was having a hard time getting appt, he would like to discuss referral today.   He is pending f/u with neurosurgery regarding his latest myelogram. I have suggested we wait and see if he will require more surgery before another pain clinic referral.   Review of Systems     Objective:   Physical Exam  Constitutional: He appears well-developed and well-nourished.  Cardiovascular: Normal rate and regular rhythm.   Pulmonary/Chest: Breath sounds normal.  Musculoskeletal: He exhibits no edema (of lower extremities).  Skin:  Papules and vesicles on his lower and upper arms and face c/w contact dermatitis.       Assessment:    1. Contact dermatitis - fluocinonide cream (LIDEX) 0.05 %; Apply 1 application topically 3 (three) times daily. To poison ivy rash  Dispense: 30 g; Refill: 0  2. Essential hypertension: continue losartan and HCTZ - metoprolol succinate (TOPROL-XL) 50 MG 24 hr tablet; Take 1 tablet (50 mg total) by mouth daily. Take with or immediately following a meal.  Dispense: 30 tablet; Refill: 1  3. Chronic cervical radiculopathy - piroxicam (FELDENE) 20 MG capsule; Take 1 capsule  (20 mg total) by mouth daily.  Dispense: 30 capsule; Refill: 0    Plan:    Use hydrocortisone cream on his face. Avoid other nsaid's while on piroxicam. Limit Tylenol use to 3000 mg/day

## 2015-11-23 NOTE — Patient Instructions (Signed)
Use hydrocortisone on the poison ivy on your face. Don't take ibuprofen or aleve with the new medication-piroxicam. Let me know what [name] proposes to do with your latest Myelogram.

## 2015-12-16 ENCOUNTER — Encounter: Payer: Self-pay | Admitting: Emergency Medicine

## 2015-12-16 ENCOUNTER — Inpatient Hospital Stay
Admission: EM | Admit: 2015-12-16 | Discharge: 2015-12-19 | DRG: 439 | Disposition: A | Discharge status: Home / Self Care | Payer: BLUE CROSS/BLUE SHIELD | Attending: Internal Medicine | Admitting: Internal Medicine

## 2015-12-16 ENCOUNTER — Ambulatory Visit (INDEPENDENT_AMBULATORY_CARE_PROVIDER_SITE_OTHER): Payer: BLUE CROSS/BLUE SHIELD | Admitting: Family Medicine

## 2015-12-16 ENCOUNTER — Inpatient Hospital Stay: Payer: BLUE CROSS/BLUE SHIELD

## 2015-12-16 ENCOUNTER — Emergency Department: Payer: BLUE CROSS/BLUE SHIELD

## 2015-12-16 ENCOUNTER — Encounter: Payer: Self-pay | Admitting: Family Medicine

## 2015-12-16 VITALS — BP 148/96 | HR 108 | Temp 98.7°F | Resp 16 | Wt 250.0 lb

## 2015-12-16 DIAGNOSIS — G8929 Other chronic pain: Secondary | ICD-10-CM | POA: Diagnosis present

## 2015-12-16 DIAGNOSIS — Z825 Family history of asthma and other chronic lower respiratory diseases: Secondary | ICD-10-CM

## 2015-12-16 DIAGNOSIS — E781 Pure hyperglyceridemia: Secondary | ICD-10-CM | POA: Diagnosis present

## 2015-12-16 DIAGNOSIS — R1012 Left upper quadrant pain: Secondary | ICD-10-CM | POA: Diagnosis not present

## 2015-12-16 DIAGNOSIS — F1010 Alcohol abuse, uncomplicated: Secondary | ICD-10-CM | POA: Diagnosis present

## 2015-12-16 DIAGNOSIS — Z79899 Other long term (current) drug therapy: Secondary | ICD-10-CM

## 2015-12-16 DIAGNOSIS — Z888 Allergy status to other drugs, medicaments and biological substances status: Secondary | ICD-10-CM | POA: Diagnosis not present

## 2015-12-16 DIAGNOSIS — Z981 Arthrodesis status: Secondary | ICD-10-CM | POA: Diagnosis not present

## 2015-12-16 DIAGNOSIS — K210 Gastro-esophageal reflux disease with esophagitis: Secondary | ICD-10-CM | POA: Diagnosis present

## 2015-12-16 DIAGNOSIS — M1990 Unspecified osteoarthritis, unspecified site: Secondary | ICD-10-CM | POA: Diagnosis present

## 2015-12-16 DIAGNOSIS — Z7951 Long term (current) use of inhaled steroids: Secondary | ICD-10-CM

## 2015-12-16 DIAGNOSIS — E785 Hyperlipidemia, unspecified: Secondary | ICD-10-CM | POA: Diagnosis present

## 2015-12-16 DIAGNOSIS — Z87891 Personal history of nicotine dependence: Secondary | ICD-10-CM

## 2015-12-16 DIAGNOSIS — R0902 Hypoxemia: Secondary | ICD-10-CM

## 2015-12-16 DIAGNOSIS — I10 Essential (primary) hypertension: Secondary | ICD-10-CM | POA: Diagnosis present

## 2015-12-16 DIAGNOSIS — M5412 Radiculopathy, cervical region: Secondary | ICD-10-CM | POA: Diagnosis not present

## 2015-12-16 DIAGNOSIS — Z8719 Personal history of other diseases of the digestive system: Secondary | ICD-10-CM | POA: Diagnosis present

## 2015-12-16 DIAGNOSIS — Z9889 Other specified postprocedural states: Secondary | ICD-10-CM

## 2015-12-16 DIAGNOSIS — Z801 Family history of malignant neoplasm of trachea, bronchus and lung: Secondary | ICD-10-CM | POA: Diagnosis not present

## 2015-12-16 DIAGNOSIS — K852 Alcohol induced acute pancreatitis without necrosis or infection: Secondary | ICD-10-CM | POA: Diagnosis not present

## 2015-12-16 DIAGNOSIS — E871 Hypo-osmolality and hyponatremia: Secondary | ICD-10-CM | POA: Diagnosis present

## 2015-12-16 DIAGNOSIS — Z8249 Family history of ischemic heart disease and other diseases of the circulatory system: Secondary | ICD-10-CM

## 2015-12-16 DIAGNOSIS — R000 Tachycardia, unspecified: Secondary | ICD-10-CM | POA: Diagnosis present

## 2015-12-16 DIAGNOSIS — E860 Dehydration: Secondary | ICD-10-CM | POA: Diagnosis present

## 2015-12-16 DIAGNOSIS — K5900 Constipation, unspecified: Secondary | ICD-10-CM | POA: Diagnosis present

## 2015-12-16 DIAGNOSIS — R52 Pain, unspecified: Secondary | ICD-10-CM

## 2015-12-16 LAB — COMPREHENSIVE METABOLIC PANEL
ALBUMIN: 4.6 g/dL (ref 3.5–5.0)
ALK PHOS: 66 U/L (ref 38–126)
ALT: 32 U/L (ref 17–63)
ANION GAP: 16 — AB (ref 5–15)
AST: 59 U/L — AB (ref 15–41)
BUN: 13 mg/dL (ref 6–20)
CO2: 21 mmol/L — AB (ref 22–32)
Calcium: 9.5 mg/dL (ref 8.9–10.3)
Chloride: 95 mmol/L — ABNORMAL LOW (ref 101–111)
Creatinine, Ser: 0.88 mg/dL (ref 0.61–1.24)
GFR calc Af Amer: >60 mL/min (ref >60)
GFR calc non Af Amer: >60 mL/min (ref >60)
GLUCOSE: 121 mg/dL — AB (ref 65–99)
POTASSIUM: 4.7 mmol/L (ref 3.5–5.1)
SODIUM: 132 mmol/L — AB (ref 135–145)
Total Bilirubin: 0.9 mg/dL (ref 0.3–1.2)
Total Protein: 7.5 g/dL (ref 6.5–8.1)

## 2015-12-16 LAB — URINALYSIS COMPLETE WITH MICROSCOPIC (ARMC ONLY)
Bilirubin Urine: NEGATIVE
Glucose, UA: NEGATIVE mg/dL
Leukocytes, UA: NEGATIVE
NITRITE: NEGATIVE
PROTEIN: 100 mg/dL — AB
Specific Gravity, Urine: 1.015 (ref 1.005–1.030)
Squamous Epithelial / LPF: NONE SEEN
pH: 5 (ref 5.0–8.0)

## 2015-12-16 LAB — LIPASE, BLOOD: LIPASE: 142 U/L — AB (ref 11–51)

## 2015-12-16 LAB — CBC
HCT: 40.7 % (ref 40.0–52.0)
HEMOGLOBIN: 14.3 g/dL (ref 13.0–17.0)
MCH: 31 pg (ref 26.0–34.0)
MCHC: 35.2 g/dL (ref 32.0–36.0)
MCV: 88.1 fL (ref 80.0–100.0)
Platelets: 212 10*3/uL (ref 150–440)
RBC: 4.62 MIL/uL (ref 4.40–5.90)
RDW: 14.5 % (ref 11.5–14.5)
WBC: 16.5 10*3/uL — AB (ref 3.8–10.6)

## 2015-12-16 MED ORDER — LORAZEPAM 1 MG PO TABS
1.0000 mg | ORAL_TABLET | Freq: Four times a day (QID) | ORAL | Status: AC | PRN
  Administered 2015-12-18 – 2015-12-19 (×5): 1 mg via ORAL
  Filled 2015-12-16 (×5): qty 1

## 2015-12-16 MED ORDER — ONDANSETRON HCL 4 MG/2ML IJ SOLN
4.0000 mg | Freq: Once | INTRAMUSCULAR | Status: AC
  Administered 2015-12-16: 4 mg via INTRAVENOUS
  Filled 2015-12-16: qty 2

## 2015-12-16 MED ORDER — ONDANSETRON HCL 4 MG/2ML IJ SOLN: 4.0000 mg | Freq: Four times a day (QID) | INTRAMUSCULAR | Status: DC | PRN

## 2015-12-16 MED ORDER — ACETAMINOPHEN 650 MG RE SUPP: 650.0000 mg | Freq: Four times a day (QID) | RECTAL | Status: DC | PRN

## 2015-12-16 MED ORDER — PREGABALIN 50 MG PO CAPS
100.0000 mg | ORAL_CAPSULE | Freq: Three times a day (TID) | ORAL | Status: DC
  Administered 2015-12-16 – 2015-12-19 (×9): 100 mg via ORAL
  Filled 2015-12-16 (×9): qty 2

## 2015-12-16 MED ORDER — MORPHINE SULFATE (PF) 4 MG/ML IV SOLN
4.0000 mg | INTRAVENOUS | Status: DC | PRN
  Administered 2015-12-16: 4 mg via INTRAVENOUS
  Filled 2015-12-16: qty 1

## 2015-12-16 MED ORDER — MORPHINE SULFATE (PF) 2 MG/ML IV SOLN
2.0000 mg | Freq: Once | INTRAVENOUS | Status: AC
  Administered 2015-12-16: 16:00:00 2 mg via INTRAVENOUS
  Filled 2015-12-16: qty 1

## 2015-12-16 MED ORDER — LORAZEPAM 2 MG/ML IJ SOLN: 1.0000 mg | Freq: Four times a day (QID) | INTRAMUSCULAR | Status: AC | PRN

## 2015-12-16 MED ORDER — DEXTROSE-NACL 5-0.9 % IV SOLN
INTRAVENOUS | Status: DC
  Administered 2015-12-16 – 2015-12-17 (×3): via INTRAVENOUS

## 2015-12-16 MED ORDER — FLUTICASONE PROPIONATE 50 MCG/ACT NA SUSP
2.0000 | Freq: Every day | NASAL | Status: DC
  Administered 2015-12-16 – 2015-12-19 (×4): 2 via NASAL
  Filled 2015-12-16: qty 16

## 2015-12-16 MED ORDER — ONDANSETRON HCL 4 MG PO TABS: 4.0000 mg | ORAL_TABLET | Freq: Four times a day (QID) | ORAL | Status: DC | PRN

## 2015-12-16 MED ORDER — GABAPENTIN 600 MG PO TABS
600.0000 mg | ORAL_TABLET | Freq: Three times a day (TID) | ORAL | Status: DC
  Administered 2015-12-16 – 2015-12-19 (×9): 600 mg via ORAL
  Filled 2015-12-16 (×9): qty 1

## 2015-12-16 MED ORDER — MORPHINE SULFATE (PF) 4 MG/ML IV SOLN
4.0000 mg | Freq: Once | INTRAVENOUS | Status: AC
  Administered 2015-12-16: 4 mg via INTRAVENOUS
  Filled 2015-12-16: qty 1

## 2015-12-16 MED ORDER — METOPROLOL SUCCINATE ER 50 MG PO TB24
50.0000 mg | ORAL_TABLET | Freq: Every day | ORAL | Status: DC
  Administered 2015-12-16 – 2015-12-19 (×4): 50 mg via ORAL
  Filled 2015-12-16 (×4): qty 1

## 2015-12-16 MED ORDER — HYDROMORPHONE HCL 1 MG/ML IJ SOLN
0.5000 mg | INTRAMUSCULAR | Status: DC | PRN
  Administered 2015-12-16 – 2015-12-19 (×20): 0.5 mg via INTRAVENOUS
  Filled 2015-12-16 (×20): qty 1

## 2015-12-16 MED ORDER — ACETAMINOPHEN 325 MG PO TABS
650.0000 mg | ORAL_TABLET | Freq: Four times a day (QID) | ORAL | Status: DC | PRN
  Administered 2015-12-16 – 2015-12-17 (×2): 650 mg via ORAL
  Filled 2015-12-16 (×2): qty 2

## 2015-12-16 MED ORDER — FLUOCINONIDE 0.05 % EX CREA: 1.0000 "application " | TOPICAL_CREAM | Freq: Three times a day (TID) | CUTANEOUS | Status: DC

## 2015-12-16 MED ORDER — CYCLOBENZAPRINE HCL 10 MG PO TABS
10.0000 mg | ORAL_TABLET | Freq: Three times a day (TID) | ORAL | Status: DC | PRN
  Administered 2015-12-17 – 2015-12-19 (×4): 10 mg via ORAL
  Filled 2015-12-16 (×4): qty 1

## 2015-12-16 MED ORDER — METOPROLOL TARTRATE 5 MG/5ML IV SOLN: 5.0000 mg | INTRAVENOUS | Status: DC | PRN

## 2015-12-16 MED ORDER — SODIUM CHLORIDE 0.9 % IV BOLUS (SEPSIS)
1000.0000 mL | Freq: Once | INTRAVENOUS | Status: AC
  Administered 2015-12-16: 1000 mL via INTRAVENOUS

## 2015-12-16 MED ORDER — ENOXAPARIN SODIUM 40 MG/0.4ML ~~LOC~~ SOLN
40.0000 mg | SUBCUTANEOUS | Status: DC
  Administered 2015-12-16 – 2015-12-18 (×3): 40 mg via SUBCUTANEOUS
  Filled 2015-12-16 (×3): qty 0.4

## 2015-12-16 MED ORDER — THIAMINE HCL 100 MG/ML IJ SOLN
Freq: Once | INTRAVENOUS | Status: AC
  Administered 2015-12-16: 15:00:00 via INTRAVENOUS
  Filled 2015-12-16: qty 1000

## 2015-12-16 MED ORDER — MORPHINE SULFATE (PF) 2 MG/ML IV SOLN
2.0000 mg | INTRAVENOUS | Status: DC | PRN
  Administered 2015-12-16: 2 mg via INTRAVENOUS
  Filled 2015-12-16: qty 1

## 2015-12-16 MED ORDER — SENNA 8.6 MG PO TABS
1.0000 | ORAL_TABLET | Freq: Two times a day (BID) | ORAL | Status: DC
  Administered 2015-12-16 – 2015-12-19 (×6): 8.6 mg via ORAL
  Filled 2015-12-16 (×6): qty 1

## 2015-12-16 MED ORDER — PIROXICAM 10 MG PO CAPS
20.0000 mg | ORAL_CAPSULE | Freq: Every day | ORAL | Status: DC
  Administered 2015-12-16 – 2015-12-19 (×4): 20 mg via ORAL
  Filled 2015-12-16 (×2): qty 2
  Filled 2015-12-16: qty 1
  Filled 2015-12-16: qty 2

## 2015-12-16 MED ORDER — PANTOPRAZOLE SODIUM 40 MG PO TBEC
40.0000 mg | DELAYED_RELEASE_TABLET | Freq: Every day | ORAL | Status: DC
  Administered 2015-12-16 – 2015-12-19 (×4): 40 mg via ORAL
  Filled 2015-12-16 (×4): qty 1

## 2015-12-16 MED ORDER — KETOROLAC TROMETHAMINE 30 MG/ML IJ SOLN
30.0000 mg | Freq: Once | INTRAMUSCULAR | Status: AC
  Administered 2015-12-16: 30 mg via INTRAVENOUS
  Filled 2015-12-16: qty 1

## 2015-12-16 NOTE — ED Notes (Signed)
Report received from Felicia RN.  

## 2015-12-16 NOTE — H&P (Signed)
[facility] [place] [facility]   PATIENT NAME: Javier Griffin    MR#:  [id number]  DATE OF BIRTH:  06/22/1971  DATE OF ADMISSION:  12/16/2015  PRIMARY CARE PHYSICIAN: Anola Gurneyobert Chauvin, PA   REQUESTING/REFERRING PHYSICIAN: Minna AntisKevin Paduchowski, MD  CHIEF COMPLAINT:   Abdominal pain HISTORY OF PRESENT ILLNESS:  Javier Griffin  is a 45 y.o. male with a known history of present hypertension, hyperlipidemia and chronic neck pain is presenting to the ED with a chief complaint of left-sided abdominal pain starting from 11 PM last night. An patient's abdominal pain was in the left upper quadrant radiating to the left flank and was sharp and stabbing in nature, 9 out of 10 associated with nausea and radiating to the back. No similar complaints in the past. Admits that he drinks alcohol heavily. According to his wife he drinks more than 12 beers per day. Denies any dizziness or loss of consciousness. Denies any chest pain or shortness of breath CT abdomen done in the ED has revealed pancreatitis of the pancreatic tail.  PAST MEDICAL HISTORY:   Past Medical History  Diagnosis Date  . Neck injury   . Hypertension     treated at one time, no medications at this time  . Bronchitis   . Headache     due to neck pain  . Arthritis   . GERD (gastroesophageal reflux disease)     PAST SURGICAL HISTOIRY:   Past Surgical History  Procedure Laterality Date  . Cervical spine surgery      x3  . Carpel tunnel Left     and elbow   . Anterior cervical decomp/discectomy fusion N/A 04/22/2013    Procedure: ANTERIOR CERVICAL DECOMPRESSION/DISCECTOMY FUSION 1 CERVICAL SEVEN-THORACIC ONELEVEL/HARDWARE REMOVAL;  Surgeon: Cristi LoronJeffrey D Jenkins, MD;  Location: [facility];  Service: Neurosurgery;  Laterality: N/A;  C7/T1 anterior cervical decompression with fusion interbody prothesis plating and bonegraft with removal of codman slim loc plate  . Elbow surgery Left     nerve release  . Wisdom  tooth extraction    . Posterior cervical fusion/foraminotomy N/A 05/26/2014    Procedure: Cervical four-five, Cervical seven-Thoracic one Posterior Cervical Instrumentation and Fusion;  Surgeon: Tressie StalkerJeffrey Jenkins, MD;  Location: [facility];  Service: Neurosurgery;  Laterality: N/A;  . Posterior cervical laminectomy N/A 02/08/2015    Procedure: Removal of Thoracic one Pedicle Screw;  Surgeon: Tressie StalkerJeffrey Jenkins, MD;  Location: [facility];  Service: Neurosurgery;  Laterality: N/A;  removal of T1 pedicle screw    SOCIAL HISTORY:   Social History  Substance Use Topics  . Smoking status: Never Smoker   . Smokeless tobacco: Former NeurosurgeonUser    Types: Snuff    Quit date: 05/24/1992  . Alcohol Use: 3.6 oz/week    6 Cans of beer per week     Comment: occasional  Patient admits to drinking 6-12 cans of beer every day sometimes more than 12 cans per day  FAMILY HISTORY:   Family History  Problem Relation Age of Onset  . Lung cancer Mother   . Hypertension Father   . Emphysema Maternal Grandfather   . Heart disease Paternal Grandfather     DRUG ALLERGIES:   Allergies  Allergen Reactions  . Amlodipine     Leg swelling  . Lisinopril Cough    REVIEW OF SYSTEMS:  CONSTITUTIONAL: No fever, fatigue or weakness.  EYES: No blurred or double vision.  EARS, NOSE, AND THROAT: No tinnitus or ear pain.  RESPIRATORY: No cough, shortness of breath, wheezing or hemoptysis.  CARDIOVASCULAR: No chest pain, orthopnea, edema.  GASTROINTESTINAL: Reporting  nausea,  denies vomiting, diarrhea , reporting epigastric and left upper quadrant  abdominal pain radiating to the left flank  GENITOURINARY: No dysuria, hematuria.  ENDOCRINE: No polyuria, nocturia,  HEMATOLOGY: No anemia, easy bruising or bleeding SKIN: No rash or lesion. MUSCULOSKELETAL: No joint pain or arthritis.   NEUROLOGIC: No tingling, numbness, weakness.  PSYCHIATRY: No anxiety or depression.   MEDICATIONS AT HOME:   Prior to Admission  medications   Medication Sig Start Date End Date Taking? Authorizing Provider  cyclobenzaprine (FLEXERIL) 10 MG tablet TAKE 1 TABLET BY MOUTH 3 TIMES A DAY AS NEEDED FOR MUSCLE SPASM IF OVERSEDATED W/OTHER MED STOP 11/15/15  Yes Malva Limes, MD  fluocinonide cream (LIDEX) 0.05 % Apply 1 application topically 3 (three) times daily. To poison ivy rash 11/23/15  Yes Anola Gurney, PA  fluticasone (FLONASE) 50 MCG/ACT nasal spray Place 2 sprays into both nostrils daily. 10/11/15  Yes Anola Gurney, PA  gabapentin (NEURONTIN) 600 MG tablet Take 600 mg by mouth 3 (three) times daily.   Yes Historical Provider, MD  hydrochlorothiazide (HYDRODIURIL) 25 MG tablet Take 1 tablet (25 mg total) by mouth daily. 10/11/15  Yes Anola Gurney, PA  lansoprazole (PREVACID) 30 MG capsule Daily as needed for acid reflux 04/04/15  Yes Anola Gurney, PA  losartan (COZAAR) 100 MG tablet Take 1 tablet (100 mg total) by mouth daily. 09/08/15  Yes Anola Gurney, PA  metoprolol succinate (TOPROL-XL) 50 MG 24 hr tablet Take 1 tablet (50 mg total) by mouth daily. Take with or immediately following a meal. 11/23/15  Yes Anola Gurney, PA  piroxicam (FELDENE) 20 MG capsule Take 1 capsule (20 mg total) by mouth daily. 11/23/15  Yes Anola Gurney, PA  pregabalin (LYRICA) 100 MG capsule Take 1 capsule (100 mg total) by mouth 3 (three) times daily. 11/08/15  Yes Anola Gurney, PA      VITAL SIGNS:  Blood pressure 123/81, pulse 118, resp. rate 24, SpO2 93 %.  PHYSICAL EXAMINATION:  GENERAL:  45 y.o.-year-old patient lying in the bed with no acute distress.  EYES: Pupils equal, round, reactive to light and accommodation. No scleral icterus. Extraocular muscles intact.  HEENT: Head atraumatic, normocephalic. Oropharynx and nasopharynx clear.  NECK:  Supple, no jugular venous distention. No thyroid enlargement, no tenderness.  LUNGS: Normal breath sounds bilaterally, no wheezing, rales,rhonchi or crepitation. No use of accessory  muscles of respiration.  CARDIOVASCULAR: S1, S2 normal. No murmurs, rubs, or gallops.  ABDOMEN: Soft,  left upper quadrant is tender but no rebound tenderness nondistended. Bowel sounds present. No organomegaly or mass. Left flank tenderness is present  EXTREMITIES: No pedal edema, cyanosis, or clubbing.  NEUROLOGIC: Cranial nerves II through XII are intact. Muscle strength 5/5 in all extremities. Sensation intact. Gait not checked.  PSYCHIATRIC: The patient is alert and oriented x 3.  SKIN: No obvious rash, lesion, or ulcer.   LABORATORY PANEL:   CBC  Recent Labs Lab 12/16/15 0954  WBC 16.5*  HGB 14.3  HCT 40.7  PLT 212   ------------------------------------------------------------------------------------------------------------------  Chemistries   Recent Labs Lab 12/16/15 0954  NA 132*  K 4.7  CL 95*  CO2 21*  GLUCOSE 121*  BUN 13  CREATININE 0.88  CALCIUM 9.5  AST 59*  ALT 32  ALKPHOS 66  BILITOT 0.9   ------------------------------------------------------------------------------------------------------------------  Cardiac Enzymes No results for input(s): TROPONINI in the last  168 hours. ------------------------------------------------------------------------------------------------------------------  RADIOLOGY:  Ct Renal Stone Study  12/16/2015  CLINICAL DATA:  45 year old male with a history of left upper quadrant pain EXAM: CT ABDOMEN AND PELVIS WITHOUT CONTRAST TECHNIQUE: Multidetector CT imaging of the abdomen and pelvis was performed following the standard protocol without IV contrast. COMPARISON:  None. FINDINGS: Lower chest: Unremarkable appearance of the soft tissues of the chest wall. Heart size within normal limits.  No pericardial fluid/thickening. No lower mediastinal adenopathy. Unremarkable appearance of the distal esophagus. Small hiatal hernia No confluent airspace disease, pleural fluid, or pneumothorax within visualized lung. Abdomen/pelvis:  Cranial caudal span of liver measures 24 cm. Unremarkable spleen. Unremarkable appearance of bilateral adrenal glands. Edematous appearance of the pancreatic tail with associated inflammatory changes of the adjacent fat. Trace fluid along the anterior para renal fascia and adjacent to the spleen. A small amount of fluid tracks into the left pericolic gutter within the special plane. No radio-opaque gallstones. No intrahepatic or extrahepatic biliary ductal dilatation. No abnormally dilated small bowel or colon. No transition point. Normal appendix identified. Mild colonic diverticular disease without associated inflammation. Right Kidney/Ureter: No hydronephrosis. No nephrolithiasis. No perinephric stranding. Unremarkable course of the right ureter. Left Kidney/Ureter: No hydronephrosis. No nephrolithiasis. No perinephric stranding. Unremarkable course of the left ureter. Unremarkable appearance of the urinary bladder. No significant vascular calcification. No aneurysm or periaortic fluid identified. Musculoskeletal: No displaced fracture identified. No significant degenerative changes of the spine. IMPRESSION: Evidence of pancreatitis involving the tail of pancreas with associated inflammatory changes in trace free fluid. Recommend correlation with lab values. Hepatomegaly and steatosis. Small hiatal hernia. Signed, Yvone Neu. Loreta Ave, DO [facility] Electronically Signed   By: Gilmer Mor D.O.   On: 12/16/2015 10:57    EKG:   Orders placed or performed during the hospital encounter of 05/24/14  . EKG 12-Lead  . EKG 12-Lead    IMPRESSION AND PLAN:   45 year old Caucasian male with history of essential hypertension, hyperlipidemia and chronic neck pain is presenting to the ED with a chief complaint of left upper quadrant abdominal pain radiating to the epigastric area and left flank associated with nausea and admits drinking alcohol heavily is  getting admitted to the hospital with acute pancreatitis  #Acute pancreatitis-alcohol induced Admit him to MedSurg unit. Nothing by mouth, aggressive hydration with IV fluids and pain management as needed Check fasting lipid panel in a.m. Counseled patient to stop drinking alcohol. Repeat a.m. labs including CMP and lipase  #Hyperlipidemia Check lipid panel in a.m. patient is not on any statin  #Hyponatremia-probably from beer poto mania Provide IV fluids and monitor serial sodiums. Mentating fine  #Essential hypertension Resume his home medications and titrate as needed  #Sinus tachycardia probably from uncontrolled abdominal pain and dehydration Provide aggressive hydration with IV fluids and Lopressor as needed  #History of GERD-Protonix  #Chronic neck pain Resume his home medications including Flexeril, gabapentin, Lyrica and Neurontin Pain management as needed  DVT prophylaxis with Lovenox subcutaneous   All the records are reviewed and case discussed with ED provider. Management plans discussed with the patient, wife and they are in agreement.  CODE STATUS: Full code, wife is the healthcare power of attorney  TOTAL TIME TAKING CARE OF THIS PATIENT: 45 minutes.    Ramonita Lab M.D on 12/16/2015 at 1:44 PM  Between 7am to 6pm - Pager - [phone]  After 6pm go to [email or web address] - password [email or web address] [facility]  Office  [phone]  CC: Primary care physician; Anola Gurney, PA

## 2015-12-16 NOTE — ED Provider Notes (Signed)
[facility] Emergency Department Provider Note  Time seen: 9:54 AM  I have reviewed the triage vital signs and the nursing notes.   HISTORY  Chief Complaint Flank Pain    HPI Javier Griffin is a 45 y.o. male with a past medical history of gastric reflux, hypertension, presents to the emergency department left flank pain. According to the patient last night he developed significant left flank pain which she describes as moderate to severe, sharp pain in his left flank radiating around to his left back. Denies any dysuria, hematuria. Denies a history of kidney stones in the past. Denies any nausea, vomiting, diarrhea, recent black or bloody stool. Patient states he went to acute care this morning to be evaluated and was sent to the emergency department for evaluation. Currently describes the pain as moderate, sharp stabbing pain to the left flank.    Past Medical History  Diagnosis Date  . Neck injury   . Hypertension     treated at one time, no medications at this time  . Bronchitis   . Headache     due to neck pain  . Arthritis   . GERD (gastroesophageal reflux disease)     Patient Active Problem List   Diagnosis Date Noted  . Allergic rhinitis 10/11/2015  . Cervical post-laminectomy syndrome 06/16/2015  . Chronic cervical radiculopathy 06/16/2015  . Acid reflux 02/14/2015  . HLD (hyperlipidemia) 02/14/2015  . BP (high blood pressure) 02/14/2015  . Cervicalgia 02/08/2015  . Pseudoarthrosis of cervical spine (HCC) 05/26/2014    Past Surgical History  Procedure Laterality Date  . Cervical spine surgery      x3  . Carpel tunnel Left     and elbow   . Anterior cervical decomp/discectomy fusion N/A 04/22/2013    Procedure: ANTERIOR CERVICAL DECOMPRESSION/DISCECTOMY FUSION 1 CERVICAL SEVEN-THORACIC ONELEVEL/HARDWARE REMOVAL;  Surgeon: [name], MD;  Location: [facility];  Service: Neurosurgery;  Laterality: N/A;  C7/T1 anterior cervical  decompression with fusion interbody prothesis plating and bonegraft with removal of codman slim loc plate  . Elbow surgery Left     nerve release  . Wisdom tooth extraction    . Posterior cervical fusion/foraminotomy N/A 05/26/2014    Procedure: Cervical four-five, Cervical seven-Thoracic one Posterior Cervical Instrumentation and Fusion;  Surgeon: Tressie Stalker, MD;  Location: [facility];  Service: Neurosurgery;  Laterality: N/A;  . Posterior cervical laminectomy N/A 02/08/2015    Procedure: Removal of Thoracic one Pedicle Screw;  Surgeon: Tressie Stalker, MD;  Location: [facility];  Service: Neurosurgery;  Laterality: N/A;  removal of T1 pedicle screw    Current Outpatient Rx  Name  Route  Sig  Dispense  Refill  . cyclobenzaprine (FLEXERIL) 10 MG tablet      TAKE 1 TABLET BY MOUTH 3 TIMES A DAY AS NEEDED FOR MUSCLE SPASM IF OVERSEDATED W/OTHER MED STOP   90 tablet   0   . fluocinonide cream (LIDEX) 0.05 %   Topical   Apply 1 application topically 3 (three) times daily. To poison ivy rash   30 g   0   . fluticasone (FLONASE) 50 MCG/ACT nasal spray   Each Nare   Place 2 sprays into both nostrils daily.   16 g   5   . gabapentin (NEURONTIN) 600 MG tablet   Oral   Take 600 mg by mouth 3 (three) times daily.         . hydrochlorothiazide (HYDRODIURIL) 25 MG tablet  Oral   Take 1 tablet (25 mg total) by mouth daily.   90 tablet   3   . lansoprazole (PREVACID) 30 MG capsule      Daily as needed for acid reflux   90 capsule   3   . losartan (COZAAR) 100 MG tablet   Oral   Take 1 tablet (100 mg total) by mouth daily.   90 tablet   1   . metoprolol succinate (TOPROL-XL) 50 MG 24 hr tablet   Oral   Take 1 tablet (50 mg total) by mouth daily. Take with or immediately following a meal.   30 tablet   1   . piroxicam (FELDENE) 20 MG capsule   Oral   Take 1 capsule (20 mg total) by mouth daily.   30 capsule   0     Take with food. Stop ibuprofen or Aleve. May  conti ...   . pregabalin (LYRICA) 100 MG capsule   Oral   Take 1 capsule (100 mg total) by mouth 3 (three) times daily.   90 capsule   5     Allergies Amlodipine and Lisinopril  Family History  Problem Relation Age of Onset  . Lung cancer Mother   . Hypertension Father   . Emphysema Maternal Grandfather   . Heart disease Paternal Grandfather     Social History Social History  Substance Use Topics  . Smoking status: Never Smoker   . Smokeless tobacco: Former NeurosurgeonUser    Types: Snuff    Quit date: 05/24/1992  . Alcohol Use: 3.6 oz/week    6 Cans of beer per week     Comment: occasional    Review of Systems Constitutional: Negative for fever. Cardiovascular: Negative for chest pain. Respiratory: Negative for shortness of breath. Gastrointestinal: Left flank pain. Negative for nausea, vomiting, diarrhea. Genitourinary: Negative for dysuria. Negative hematuria Musculoskeletal: Pain radiates around to left back Neurological: Negative for headache 10-point ROS otherwise negative.  ____________________________________________   PHYSICAL EXAM:  VITAL SIGNS: ED Triage Vitals  Enc Vitals Group     BP 12/16/15 0950 186/94 mmHg     Pulse Rate 12/16/15 0950 120     Resp --      Temp --      Temp src --      SpO2 12/16/15 0950 96 %     Weight --      Height --      Head Cir --      Peak Flow --      Pain Score 12/16/15 0943 9     Pain Loc --      Pain Edu? --      Excl. in GC? --     Constitutional: Alert and oriented. Well appearing. Mild distress due to pain. Eyes: Normal exam ENT   Head: Normocephalic and atraumatic.   Mouth/Throat: Mucous membranes are moist. Cardiovascular: Normal rate, regular rhythm. No murmur Respiratory: Normal respiratory effort without tachypnea nor retractions. Breath sounds are clear Gastrointestinal: Soft and nontender. No distention.  There is no CVA tenderness. Musculoskeletal: Nontender with normal range of motion in all  extremities.  Neurologic:  Normal speech and language. No gross focal neurologic deficits  Skin:  Skin is warm, dry and intact.  Psychiatric: Mood and affect are normal.  ____________________________________________    RADIOLOGY  CT shows possible pancreatitis  ____________________________________________    INITIAL IMPRESSION / ASSESSMENT AND PLAN / ED COURSE  Pertinent labs & imaging results that were  available during my care of the patient were reviewed by me and considered in my medical decision making (see chart for details).  Patient presents the emergency department left flank pain. Exam and story very consistent with ureterolithiasis. Patient has no history of ureterolithiasis in the past. We'll obtain labs, CT scan, treat the patient's pain and IV hydrate.  CT consistent with pancreatitis. Patient remains in pain status post 3 rounds of pain medication. Pulse rate of 120. Lipase 142. Given the CT findings and continued pain we will admit to the hospital for acute pancreatitis. Patient metastases to drinking approximately 6 beers per day. Highly suspect alcoholic pancreatitis.  ____________________________________________   FINAL CLINICAL IMPRESSION(S) / ED DIAGNOSES  Acute pancreatitis   Minna Antis, MD 12/16/15 1210

## 2015-12-16 NOTE — Progress Notes (Signed)
Patient ID: Javier Griffin, male   DOB: 05/20/1971, [age] y.o.   MRN: [id number]        Patient: Javier Griffin Male    DOB: 06/08/1971   45 y.o.   MRN: [id number] Visit Date: 12/16/2015  Today's Provider: Megan Mansichard Jeannia Tatro Jr, MD   Chief Complaint  Patient presents with  . Abdominal Pain  . Back Pain   Subjective:    Abdominal Pain This is a new problem. The current episode started today (Started overnight last night. ). The onset quality is sudden. The problem occurs constantly. The problem has been unchanged. The pain is located in the LUQ. The pain is at a severity of 9/10. The pain is severe. The quality of the pain is sharp (Stabbing. ). The abdominal pain radiates to the back. Pertinent negatives include no anorexia, arthralgias, belching, constipation, diarrhea, dysuria, frequency, headaches, hematuria, nausea or vomiting. The pain is aggravated by deep breathing and movement. The pain is relieved by nothing.       Allergies  Allergen Reactions  . Amlodipine     Leg swelling  . Lisinopril Cough   Previous Medications   CYCLOBENZAPRINE (FLEXERIL) 10 MG TABLET    TAKE 1 TABLET BY MOUTH 3 TIMES A DAY AS NEEDED FOR MUSCLE SPASM IF OVERSEDATED W/OTHER MED STOP   FLUOCINONIDE CREAM (LIDEX) 0.05 %    Apply 1 application topically 3 (three) times daily. To poison ivy rash   FLUTICASONE (FLONASE) 50 MCG/ACT NASAL SPRAY    Place 2 sprays into both nostrils daily.   GABAPENTIN (NEURONTIN) 600 MG TABLET    Take 600 mg by mouth 3 (three) times daily.   HYDROCHLOROTHIAZIDE (HYDRODIURIL) 25 MG TABLET    Take 1 tablet (25 mg total) by mouth daily.   LANSOPRAZOLE (PREVACID) 30 MG CAPSULE    Daily as needed for acid reflux   LOSARTAN (COZAAR) 100 MG TABLET    Take 1 tablet (100 mg total) by mouth daily.   METOPROLOL SUCCINATE (TOPROL-XL) 50 MG 24 HR TABLET    Take 1 tablet (50 mg total) by mouth daily. Take with or immediately following a meal.   PIROXICAM (FELDENE) 20 MG CAPSULE    Take 1  capsule (20 mg total) by mouth daily.   PREGABALIN (LYRICA) 100 MG CAPSULE    Take 1 capsule (100 mg total) by mouth 3 (three) times daily.    Review of Systems  Constitutional: Negative.   Respiratory: Negative.   Gastrointestinal: Positive for abdominal pain. Negative for nausea, vomiting, diarrhea, constipation, blood in stool, abdominal distention, anal bleeding, rectal pain and anorexia.  Genitourinary: Negative for dysuria, urgency, frequency, hematuria, decreased urine volume and difficulty urinating.  Musculoskeletal: Negative for arthralgias.  Skin: Positive for rash (On both his arms. ). Negative for color change, pallor and wound.  Neurological: Negative for dizziness, light-headedness and headaches.  Hematological: Negative.   Psychiatric/Behavioral: Negative.     Social History  Substance Use Topics  . Smoking status: Never Smoker   . Smokeless tobacco: Former NeurosurgeonUser    Types: Snuff    Quit date: 05/24/1992  . Alcohol Use: 3.6 oz/week    6 Cans of beer per week     Comment: occasional   Objective:   BP 148/96 mmHg  Pulse 108  Temp(Src) 98.7 F (37.1 C) (Oral)  Resp 16  Wt 250 lb (113.399 kg)  Physical Exam  Constitutional: He is oriented to person, place, and time. He appears well-developed and well-nourished. He appears  distressed.  HENT:  Head: Normocephalic and atraumatic.  Right Ear: External ear normal.  Left Ear: External ear normal.  Nose: Nose normal.  Eyes: Conjunctivae are normal.  Neck: Neck supple.  Cardiovascular: Normal rate, regular rhythm and normal heart sounds.   Patient tachycardic at about 120 bpm  Abdominal: Soft. He exhibits no distension. There is no tenderness. There is no rebound.  Lymphadenopathy:    He has no cervical adenopathy.  Neurological: He is alert and oriented to person, place, and time.  Skin: Skin is warm and dry.  Psychiatric: He has a normal mood and affect. His behavior is normal. Thought content normal.    Well-nourished, muscular white male in obvious pain. Skin is warm and dry. He is in no respiratory distress. He does appear to have decreased breath sounds in the left base oximetry initially is 87% and it goes up to 95% after about 30 seconds. There is no skin rash. Abdominal exam is benign. No guarding rebound or mass.    Assessment & Plan:     Left upper quadrant abdominal pain   uncertain etiology. Due to mild hypoxia and tachycardia refer to ED for complete workup. I am suspicious of kidney stone. I am concerned that this could be a pneumothorax. I think the patient has a high tolerance to pain as he is waiting cervical spine surgery and he is very uncomfortable today and unable to find a comfortable position in the office. Discussed with ED that he is on the way. I in no way felt that this was a cardiac origin for this pain. I have done the exam and reviewed the above chart and it is accurate to the best of my knowledge.   Annaleigh Steinmeyer Wendelyn Breslow, MD  [facility]

## 2015-12-16 NOTE — ED Notes (Signed)
Pending ready bed to call report.

## 2015-12-16 NOTE — ED Notes (Signed)
Reports left flank pain.  Skin w/d with good color

## 2015-12-17 LAB — HEMOGLOBIN A1C: Hgb A1c MFr Bld: 5.5 % (ref 4.0–6.0)

## 2015-12-17 LAB — COMPREHENSIVE METABOLIC PANEL
ALBUMIN: 3.5 g/dL (ref 3.5–5.0)
ALT: 78 U/L — ABNORMAL HIGH (ref 17–63)
AST: UNDETERMINED U/L (ref 15–41)
Alkaline Phosphatase: 41 U/L (ref 38–126)
Anion gap: UNDETERMINED (ref 5–15)
BUN: 11 mg/dL (ref 6–20)
CHLORIDE: 100 mmol/L — AB (ref 101–111)
CO2: 25 mmol/L (ref 22–32)
Calcium: 8 mg/dL — ABNORMAL LOW (ref 8.9–10.3)
Creatinine, Ser: 0.97 mg/dL (ref 0.61–1.24)
GFR calc Af Amer: >60 mL/min (ref >60)
GLUCOSE: 139 mg/dL — AB (ref 65–99)
POTASSIUM: UNDETERMINED mmol/L (ref 3.5–5.1)
SODIUM: UNDETERMINED mmol/L (ref 135–145)
Total Bilirubin: UNDETERMINED mg/dL (ref 0.3–1.2)
Total Protein: UNDETERMINED g/dL (ref 6.5–8.1)

## 2015-12-17 LAB — CBC
HEMATOCRIT: 34.7 % — AB (ref 40.0–52.0)
Hemoglobin: 12 g/dL — ABNORMAL LOW (ref 13.0–18.0)
MCH: 30.6 pg (ref 26.0–34.0)
MCHC: 34.7 g/dL (ref 32.0–36.0)
MCV: 88.1 fL (ref 80.0–100.0)
Platelets: 144 10*3/uL — ABNORMAL LOW (ref 150–440)
RBC: 3.94 MIL/uL — ABNORMAL LOW (ref 4.40–5.90)
RDW: 13.4 % (ref 11.5–14.5)
WBC: 10.5 10*3/uL (ref 3.8–10.6)

## 2015-12-17 LAB — LIPID PANEL
CHOLESTEROL: 420 mg/dL — AB (ref 0–200)
LDL Cholesterol: UNDETERMINED mg/dL (ref 0–99)
Triglycerides: 2846 mg/dL — ABNORMAL HIGH (ref <150)
VLDL: UNDETERMINED mg/dL (ref 0–40)

## 2015-12-17 LAB — PROTIME-INR
INR: 0.96
Prothrombin Time: 13 seconds (ref 11.4–15.0)

## 2015-12-17 MED ORDER — LOSARTAN POTASSIUM 50 MG PO TABS
100.0000 mg | ORAL_TABLET | Freq: Every day | ORAL | Status: DC
  Administered 2015-12-17 – 2015-12-19 (×3): 100 mg via ORAL
  Filled 2015-12-17 (×3): qty 2

## 2015-12-17 MED ORDER — GEMFIBROZIL 600 MG PO TABS
600.0000 mg | ORAL_TABLET | Freq: Two times a day (BID) | ORAL | Status: DC
  Administered 2015-12-17 – 2015-12-19 (×5): 600 mg via ORAL
  Filled 2015-12-17 (×5): qty 1

## 2015-12-17 MED ORDER — OMEGA-3-ACID ETHYL ESTERS 1 G PO CAPS
1.0000 g | ORAL_CAPSULE | Freq: Two times a day (BID) | ORAL | Status: DC
  Administered 2015-12-17 – 2015-12-19 (×5): 1 g via ORAL
  Filled 2015-12-17 (×5): qty 1

## 2015-12-17 MED ORDER — SODIUM CHLORIDE 0.9 % IV SOLN
INTRAVENOUS | Status: AC
  Administered 2015-12-17 – 2015-12-18 (×3): via INTRAVENOUS

## 2015-12-17 NOTE — Plan of Care (Signed)
Problem: Nutritional: Goal: Ability to achieve adequate nutritional intake will improve Outcome: Not Applicable Date Met:  01/41/59 Pt currently NPO. Increasing pain unrelieved by Morphine. MD notified.  Abdominal xray ordered. Negative results. MD notified again with results. Morphine discontinued and changed to Dilaudid 0.5 mg q 3 hrs prn. Pain level improved after Dilaudid. Pt has been 0 on detox scale so far this shift.

## 2015-12-17 NOTE — Progress Notes (Signed)
[facility] [place] [facility]   PATIENT NAME: Javier Griffin    MR#:  [id number]  DATE OF BIRTH:  08/04/1971  SUBJECTIVE:    Continues to have abdominal pain especially when he turns.  REVIEW OF SYSTEMS:    Review of Systems  Constitutional: Negative for fever, chills and malaise/fatigue.  HENT: Negative for ear discharge, ear pain, hearing loss, nosebleeds and sore throat.   Eyes: Negative for blurred vision and pain.  Respiratory: Negative for cough, hemoptysis, shortness of breath and wheezing.   Cardiovascular: Negative for chest pain, palpitations and leg swelling.  Gastrointestinal: Positive for nausea and abdominal pain. Negative for vomiting, diarrhea and blood in stool.  Genitourinary: Negative for dysuria.  Musculoskeletal: Negative for back pain.  Neurological: Negative for dizziness, tremors, speech change, focal weakness, seizures and headaches.  Endo/Heme/Allergies: Does not bruise/bleed easily.  Psychiatric/Behavioral: Negative for depression, suicidal ideas and hallucinations.    Tolerating Diet: Nothing by mouth      DRUG ALLERGIES:   Allergies  Allergen Reactions  . Amlodipine     Leg swelling  . Lisinopril Cough    VITALS:  Blood pressure 128/75, pulse 93, temperature 99.8 F (37.7 C), temperature source Oral, resp. rate 20, height 6' (1.829 m), weight 112.294 kg (247 lb 9 oz), SpO2 91 %.  PHYSICAL EXAMINATION:   Physical Exam  Constitutional: He is oriented to person, place, and time and well-developed, well-nourished, and in no distress. No distress.  HENT:  Head: Normocephalic.  Eyes: No scleral icterus.  Neck: Normal range of motion. Neck supple. No JVD present. No tracheal deviation present.  Cardiovascular: Normal rate, regular rhythm and normal heart sounds.  Exam reveals no gallop and no friction rub.   No murmur heard. Pulmonary/Chest: Effort normal and breath sounds normal. No respiratory distress. He has no  wheezes. He has no rales. He exhibits no tenderness.  Abdominal: Soft. Bowel sounds are normal. He exhibits no distension and no mass. There is tenderness. There is no rebound and no guarding.  Musculoskeletal: Normal range of motion. He exhibits no edema.  Neurological: He is alert and oriented to person, place, and time.  Skin: Skin is warm. No rash noted. No erythema.  Psychiatric: Affect and judgment normal.      LABORATORY PANEL:   CBC  Recent Labs Lab 12/17/15 0542  WBC 10.5  HGB 12.0*  HCT 34.7*  PLT 144*   ------------------------------------------------------------------------------------------------------------------  Chemistries   Recent Labs Lab 12/17/15 0542  NA UNABLE TO REPORT DUE TO LIPEMIC INTERFERENCE  K UNABLE TO REPORT DUE TO LIPEMIC INTERFERENCE  CL 100*  CO2 25  GLUCOSE 139*  BUN 11  CREATININE 0.97  CALCIUM 8.0*  AST UNABLE TO REPORT DUE TO LIPEMIC INTERFERENCE  ALT 78*  ALKPHOS 41  BILITOT UNABLE TO REPORT DUE TO LIPEMIC INTERFERENCE   ------------------------------------------------------------------------------------------------------------------  Cardiac Enzymes No results for input(s): TROPONINI in the last 168 hours. ------------------------------------------------------------------------------------------------------------------  RADIOLOGY:  Dg Abd 1 View  12/16/2015  CLINICAL DATA:  Acute left-sided flank pain. EXAM: ABDOMEN - 1 VIEW COMPARISON:  None. FINDINGS: The bowel gas pattern is normal. No radio-opaque calculi or other significant radiographic abnormality are seen. IMPRESSION: No evidence of bowel obstruction or ileus. Electronically Signed   By: Lupita RaiderJames  Green Jr, M.D.   On: 12/16/2015 21:19   Ct Renal Stone Study  12/16/2015  CLINICAL DATA:  45 year old male with a history of left upper quadrant pain EXAM: CT ABDOMEN AND PELVIS WITHOUT CONTRAST TECHNIQUE: Multidetector  CT imaging of the abdomen and pelvis was performed  following the standard protocol without IV contrast. COMPARISON:  None. FINDINGS: Lower chest: Unremarkable appearance of the soft tissues of the chest wall. Heart size within normal limits.  No pericardial fluid/thickening. No lower mediastinal adenopathy. Unremarkable appearance of the distal esophagus. Small hiatal hernia No confluent airspace disease, pleural fluid, or pneumothorax within visualized lung. Abdomen/pelvis: Cranial caudal span of liver measures 24 cm. Unremarkable spleen. Unremarkable appearance of bilateral adrenal glands. Edematous appearance of the pancreatic tail with associated inflammatory changes of the adjacent fat. Trace fluid along the anterior para renal fascia and adjacent to the spleen. A small amount of fluid tracks into the left pericolic gutter within the special plane. No radio-opaque gallstones. No intrahepatic or extrahepatic biliary ductal dilatation. No abnormally dilated small bowel or colon. No transition point. Normal appendix identified. Mild colonic diverticular disease without associated inflammation. Right Kidney/Ureter: No hydronephrosis. No nephrolithiasis. No perinephric stranding. Unremarkable course of the right ureter. Left Kidney/Ureter: No hydronephrosis. No nephrolithiasis. No perinephric stranding. Unremarkable course of the left ureter. Unremarkable appearance of the urinary bladder. No significant vascular calcification. No aneurysm or periaortic fluid identified. Musculoskeletal: No displaced fracture identified. No significant degenerative changes of the spine. IMPRESSION: Evidence of pancreatitis involving the tail of pancreas with associated inflammatory changes in trace free fluid. Recommend correlation with lab values. Hepatomegaly and steatosis. Small hiatal hernia. Signed, Yvone Neu. Loreta Ave, DO [facility] Electronically Signed   By: Gilmer Mor D.O.   On: 12/16/2015 10:57     ASSESSMENT  AND PLAN:   45 year old male with acute pancreatitis  1. Acute pancreatitis: This is due to elevated triglyceride level as well as EtOH abuse. Continue aggressive IV fluids and supportive care. Follow-up on labs for tomorrow. This morning sodium and potassium were unable to be calculated due to elevated triglyceride level. Clear liquid diet when abdominal pain has resolved. Discussed the patient should not drink EtOH. Start gemfibrozil and fish oil for elevated triglyceride level.  2. Hypertriglyceridemia: Start gemfibrozil and fish oil. Check hemoglobin A1c.  3. Essential hypertension: Continue metoprolol and losartan. HCTZ discontinued due to low sodium level and pancreatitis.  4. EtOH abuse: Continue CIWA protocol.    Management plans discussed with the patient and he is in agreement.  CODE STATUS: FULL  TOTAL TIME TAKING CARE OF THIS PATIENT: 31 minutes.     POSSIBLE D/C 2 days, DEPENDING ON CLINICAL CONDITION.   Hassie Mandt M.D on 12/17/2015 at 9:49 AM  Between 7am to 6pm - Pager - [phone] After 6pm go to [email or web address] - password [email or web address]  [facility]  Office  [phone]  CC: Primary care physician; Anola Gurney, PA  Note: This dictation was prepared with Dragon dictation along with smaller phrase technology. Any transcriptional errors that result from this process are unintentional.

## 2015-12-17 NOTE — Plan of Care (Signed)
Problem: Pain Managment: Goal: General experience of comfort will improve Outcome: Not Progressing Pain verbalized as an 8 on 0-10 scale; never met goat rate of 5 this shift;      

## 2015-12-18 ENCOUNTER — Inpatient Hospital Stay: Payer: BLUE CROSS/BLUE SHIELD

## 2015-12-18 ENCOUNTER — Telehealth: Payer: Self-pay | Admitting: Family Medicine

## 2015-12-18 LAB — COMPREHENSIVE METABOLIC PANEL
ALBUMIN: 3.2 g/dL — AB (ref 3.5–5.0)
ALT: 39 U/L (ref 17–63)
ANION GAP: 8 (ref 5–15)
AST: 34 U/L (ref 15–41)
Alkaline Phosphatase: 50 U/L (ref 38–126)
BUN: 11 mg/dL (ref 6–20)
CO2: 25 mmol/L (ref 22–32)
Calcium: 8.2 mg/dL — ABNORMAL LOW (ref 8.9–10.3)
Chloride: 103 mmol/L (ref 101–111)
Creatinine, Ser: 0.8 mg/dL (ref 0.61–1.24)
GFR calc non Af Amer: >60 mL/min (ref >60)
GLUCOSE: 102 mg/dL — AB (ref 65–99)
POTASSIUM: 3.7 mmol/L (ref 3.5–5.1)
SODIUM: 136 mmol/L (ref 135–145)
TOTAL PROTEIN: 6.8 g/dL (ref 6.5–8.1)
Total Bilirubin: 1.1 mg/dL (ref 0.3–1.2)

## 2015-12-18 LAB — LIPASE, BLOOD: Lipase: 23 U/L (ref 11–51)

## 2015-12-18 MED ORDER — LACTULOSE 10 GM/15ML PO SOLN
30.0000 g | Freq: Two times a day (BID) | ORAL | Status: DC
  Administered 2015-12-18: 14:00:00 30 g via ORAL
  Filled 2015-12-18: qty 60

## 2015-12-18 MED ORDER — GEMFIBROZIL 600 MG PO TABS: 600.0000 mg | ORAL_TABLET | Freq: Two times a day (BID) | ORAL | Status: DC

## 2015-12-18 MED ORDER — ACETAMINOPHEN-CODEINE #2 300-15 MG PO TABS: 1.0000 | ORAL_TABLET | ORAL | Status: DC | PRN

## 2015-12-18 MED ORDER — SODIUM CHLORIDE 0.9 % IV SOLN
INTRAVENOUS | Status: AC
  Administered 2015-12-18 – 2015-12-19 (×4): via INTRAVENOUS

## 2015-12-18 MED ORDER — OMEGA-3-ACID ETHYL ESTERS 1 G PO CAPS: 1.0000 g | ORAL_CAPSULE | Freq: Two times a day (BID) | ORAL | Status: DC

## 2015-12-18 MED ORDER — IOPAMIDOL (ISOVUE-300) INJECTION 61%
100.0000 mL | Freq: Once | INTRAVENOUS | Status: AC | PRN
  Administered 2015-12-18: 100 mL via INTRAVENOUS

## 2015-12-18 MED ORDER — DIATRIZOATE MEGLUMINE & SODIUM 66-10 % PO SOLN
15.0000 mL | ORAL | Status: AC
  Administered 2015-12-18 (×2): 15 mL via ORAL

## 2015-12-18 NOTE — Discharge Summary (Addendum)
[facility] [place] at [facility]   PATIENT NAME: Javier Griffin    MR#:  [id number]  DATE OF BIRTH:  15-Sep-1970  DATE OF ADMISSION:  12/16/2015 ADMITTING PHYSICIAN: Ramonita Lab, MD  DATE OF DISCHARGE: 12/18/2015  PRIMARY CARE PHYSICIAN: Anola Gurney, PA    ADMISSION DIAGNOSIS:  Alcohol-induced acute pancreatitis, unspecified complication status [K85.20]  DISCHARGE DIAGNOSIS:  Active Problems:   Acute alcoholic pancreatitis   SECONDARY DIAGNOSIS:   Past Medical History  Diagnosis Date  . Neck injury   . Hypertension     treated at one time, no medications at this time  . Bronchitis   . Headache     due to neck pain  . Arthritis   . GERD (gastroesophageal reflux disease)     HOSPITAL COURSE:  45 year old male who presented yes with acute pancreatitis  1. Acute pancreatitis: This is due to elevated triglyceride level as well as EtOH abuse. Patient was started on aggressive IV fluids and pain control. Patient's pain is better controlled. He was also started on gemfibrozil and fish oil for elevated triglyceride level. He was Advised to stop EtOH. 2. Hypertriglyceridemia: He will continue gemfibrozil and fish oil at discharge. His hemoglobin A1c was 5.6. He will need a follow-up with repeat triglyceride/cholesterol levels in 4 weeks.   3. Essential hypertension: He can continue metoprolol, lovastatin and HCTZ. HCTZ was initially discontinued due to low sodium level. Hyponatremia was due to dehydration and acute pancreatitis.  Sodium level has improved and therefore patient may resume HCTZ.  It is advised that patient has a BMP checked in about a week to assure that sodium level remains within normal limits. 4. EtOH abuse: He was placed on CIWA protocol while in the hospital. He did not have signs of active withdrawal.   DISCHARGE CONDITIONS AND DIET:   Stable for discharge on a regular diet/low fat  CONSULTS OBTAINED:     DRUG ALLERGIES:    Allergies  Allergen Reactions  . Amlodipine     Leg swelling  . Lisinopril Cough    DISCHARGE MEDICATIONS:   Current Discharge Medication List    START taking these medications   Details  acetaminophen-codeine (TYLENOL #2) 300-15 MG tablet Take 1 tablet by mouth every 4 (four) hours as needed for moderate pain. Qty: 15 tablet, Refills: 0    gemfibrozil (LOPID) 600 MG tablet Take 1 tablet (600 mg total) by mouth 2 (two) times daily before a meal. Qty: 60 tablet, Refills: 0    omega-3 acid ethyl esters (LOVAZA) 1 g capsule Take 1 capsule (1 g total) by mouth 2 (two) times daily. Qty: 60 capsule, Refills: 0      CONTINUE these medications which have NOT CHANGED   Details  cyclobenzaprine (FLEXERIL) 10 MG tablet TAKE 1 TABLET BY MOUTH 3 TIMES A DAY AS NEEDED FOR MUSCLE SPASM IF OVERSEDATED W/OTHER MED STOP Qty: 90 tablet, Refills: 0   Associated Diagnoses: Chronic cervical radiculopathy    fluocinonide cream (LIDEX) 0.05 % Apply 1 application topically 3 (three) times daily. To poison ivy rash Qty: 30 g, Refills: 0   Associated Diagnoses: Contact dermatitis    fluticasone (FLONASE) 50 MCG/ACT nasal spray Place 2 sprays into both nostrils daily. Qty: 16 g, Refills: 5   Associated Diagnoses: Allergic rhinitis due to pollen    gabapentin (NEURONTIN) 600 MG tablet Take 600 mg by mouth 3 (three) times daily.    hydrochlorothiazide (HYDRODIURIL) 25 MG tablet Take 1 tablet (25 mg total)  by mouth daily. Qty: 90 tablet, Refills: 3   Associated Diagnoses: Essential hypertension    lansoprazole (PREVACID) 30 MG capsule Daily as needed for acid reflux Qty: 90 capsule, Refills: 3   Associated Diagnoses: Gastroesophageal reflux disease, esophagitis presence not specified    losartan (COZAAR) 100 MG tablet Take 1 tablet (100 mg total) by mouth daily. Qty: 90 tablet, Refills: 1   Associated Diagnoses: Essential hypertension    metoprolol succinate (TOPROL-XL) 50 MG 24 hr tablet  Take 1 tablet (50 mg total) by mouth daily. Take with or immediately following a meal. Qty: 30 tablet, Refills: 1   Associated Diagnoses: Essential hypertension    piroxicam (FELDENE) 20 MG capsule Take 1 capsule (20 mg total) by mouth daily. Qty: 30 capsule, Refills: 0   Associated Diagnoses: Chronic cervical radiculopathy    pregabalin (LYRICA) 100 MG capsule Take 1 capsule (100 mg total) by mouth 3 (three) times daily. Qty: 90 capsule, Refills: 5   Associated Diagnoses: Cervical post-laminectomy syndrome; Cervicalgia              Today   CHIEF COMPLAINT:   Patient has neck pain. Abdominal pain has improved since admission. He tolerated diet of clear liquids and will try soft diet.   VITAL SIGNS:  Blood pressure 140/92, pulse 94, temperature 98.9 F (37.2 C), temperature source Oral, resp. rate 18, height 6' (1.829 m), weight 112.294 kg (247 lb 9 oz), SpO2 97 %.   REVIEW OF SYSTEMS:  Review of Systems  Constitutional: Negative for fever, chills and malaise/fatigue.  HENT: Negative for ear discharge, ear pain, hearing loss, nosebleeds and sore throat.   Eyes: Negative for blurred vision and pain.  Respiratory: Negative for cough, hemoptysis, shortness of breath and wheezing.   Cardiovascular: Negative for chest pain, palpitations and leg swelling.  Gastrointestinal: Negative for nausea, vomiting, abdominal pain, diarrhea and blood in stool.  Genitourinary: Negative for dysuria.  Musculoskeletal: Positive for neck pain. Negative for back pain.  Neurological: Negative for dizziness, tremors, speech change, focal weakness, seizures and headaches.  Endo/Heme/Allergies: Does not bruise/bleed easily.  Psychiatric/Behavioral: Negative for depression, suicidal ideas and hallucinations.     PHYSICAL EXAMINATION:  GENERAL:  45 y.o.-year-old patient lying in the bed with no acute distress.  NECK:  Supple, no jugular venous distention. No thyroid enlargement, no tenderness.   LUNGS: Normal breath sounds bilaterally, no wheezing, rales,rhonchi  No use of accessory muscles of respiration.  CARDIOVASCULAR: S1, S2 normal. No murmurs, rubs, or gallops.  ABDOMEN: Soft, non-tender, non-distended. Bowel sounds present. No organomegaly or mass.  EXTREMITIES: No pedal edema, cyanosis, or clubbing.  PSYCHIATRIC: The patient is alert and oriented x 3.  SKIN: No obvious rash, lesion, or ulcer.   DATA REVIEW:   CBC  Recent Labs Lab 12/17/15 0542  WBC 10.5  HGB 12.0*  HCT 34.7*  PLT 144*    Chemistries   Recent Labs Lab 12/18/15 0453  NA 136  K 3.7  CL 103  CO2 25  GLUCOSE 102*  BUN 11  CREATININE 0.80  CALCIUM 8.2*  AST 34  ALT 39  ALKPHOS 50  BILITOT 1.1    Cardiac Enzymes No results for input(s): TROPONINI in the last 168 hours.  Microbiology Results  @[email or web address] @  RADIOLOGY:  Dg Abd 1 View  12/16/2015  CLINICAL DATA:  Acute left-sided flank pain. EXAM: ABDOMEN - 1 VIEW COMPARISON:  None. FINDINGS: The bowel gas pattern is normal. No radio-opaque calculi or other significant radiographic abnormality are seen.  IMPRESSION: No evidence of bowel obstruction or ileus. Electronically Signed   By: Lupita Raider, M.D.   On: 12/16/2015 21:19      Management plans discussed with the patient and he is in agreement. Stable for discharge   Patient should follow up with PCP in 1 week  CODE STATUS:     Code Status Orders        Start     Ordered   12/16/15 1402  Full code   Continuous     12/16/15 1401    Code Status History    Date Active Date Inactive Code Status Order ID Comments User Context   11/21/2015  8:33 AM 11/22/2015  3:26 AM Full Code [id number]  Sebastian Ache, MD HOV   02/08/2015  6:58 PM 02/09/2015  5:21 PM Full Code [id number]  Tressie Stalker, MD Inpatient   05/26/2014  8:58 PM 05/29/2014  4:47 PM Full Code [id number]  Tressie Stalker, MD Inpatient   04/22/2013  2:46 PM 04/23/2013  2:01 PM Full Code [id number]  Tressie Stalker, MD  Inpatient      TOTAL TIME TAKING CARE OF THIS PATIENT: 35 minutes.    Note: This dictation was prepared with Dragon dictation along with smaller phrase technology. Any transcriptional errors that result from this process are unintentional.  Donnette Macmullen M.D on 12/19/2015 at 10:15 AM  Between 7am to 6pm - Pager - [phone] After 6pm go to [email or web address] - Social research officer, government  [facility]  Office  [phone]  CC: Primary care physician; Anola Gurney, PA

## 2015-12-18 NOTE — Progress Notes (Signed)
Pt c/o pain after eating breakfast and lunch. Dr. Juliene PinaMody made aware. Discharge cancelled and abdominal CT ordered.  Pt has to be NPO for 4 hours so scan will be done around 5 pm.  Orson Apeanielle Boden Stucky, RN

## 2015-12-18 NOTE — Progress Notes (Signed)
[facility] - [place] at [facility]   PATIENT NAME: Javier Griffin    MR#:  [id number]  DATE OF BIRTH:  1971/02/27  SUBJECTIVE:    Abdominal pain better.  REVIEW OF SYSTEMS:    Review of Systems  Constitutional: Negative for fever, chills and malaise/fatigue.  HENT: Negative for ear discharge, ear pain, hearing loss, nosebleeds and sore throat.   Eyes: Negative for blurred vision and pain.  Respiratory: Negative for cough, hemoptysis, shortness of breath and wheezing.   Cardiovascular: Negative for chest pain, palpitations and leg swelling.  Gastrointestinal: Positive for abdominal pain and constipation. Negative for nausea, vomiting, diarrhea and blood in stool.  Genitourinary: Negative for dysuria.  Musculoskeletal: Negative for back pain.  Neurological: Negative for dizziness, tremors, speech change, focal weakness, seizures and headaches.  Endo/Heme/Allergies: Does not bruise/bleed easily.  Psychiatric/Behavioral: Negative for depression, suicidal ideas and hallucinations.    Tolerating Diet: Nothing by mouth      DRUG ALLERGIES:   Allergies  Allergen Reactions  . Amlodipine     Leg swelling  . Lisinopril Cough    VITALS:  Blood pressure 129/86, pulse 81, temperature 97.5 F (36.4 C), temperature source Oral, resp. rate 18, height 6' (1.829 m), weight 112.294 kg (247 lb 9 oz), SpO2 99 %.  PHYSICAL EXAMINATION:   Physical Exam  Constitutional: He is oriented to person, place, and time and well-developed, well-nourished, and in no distress. No distress.  HENT:  Head: Normocephalic.  Eyes: No scleral icterus.  Neck: Normal range of motion. Neck supple. No JVD present. No tracheal deviation present.  Cardiovascular: Normal rate, regular rhythm and normal heart sounds.  Exam reveals no gallop and no friction rub.   No murmur heard. Pulmonary/Chest: Effort normal and breath sounds normal. No respiratory distress. He has no wheezes. He has no rales.  He exhibits no tenderness.  Abdominal: Soft. Bowel sounds are normal. He exhibits no distension and no mass. There is no tenderness. There is no rebound and no guarding.  Musculoskeletal: Normal range of motion. He exhibits no edema.  Neurological: He is alert and oriented to person, place, and time.  Skin: Skin is warm. No rash noted. No erythema.  Psychiatric: Affect and judgment normal.      LABORATORY PANEL:   CBC  Recent Labs Lab 12/17/15 0542  WBC 10.5  HGB 12.0*  HCT 34.7*  PLT 144*   ------------------------------------------------------------------------------------------------------------------  Chemistries   Recent Labs Lab 12/18/15 0453  NA 136  K 3.7  CL 103  CO2 25  GLUCOSE 102*  BUN 11  CREATININE 0.80  CALCIUM 8.2*  AST 34  ALT 39  ALKPHOS 50  BILITOT 1.1   ------------------------------------------------------------------------------------------------------------------  Cardiac Enzymes No results for input(s): TROPONINI in the last 168 hours. ------------------------------------------------------------------------------------------------------------------  RADIOLOGY:  Dg Abd 1 View  12/16/2015  CLINICAL DATA:  Acute left-sided flank pain. EXAM: ABDOMEN - 1 VIEW COMPARISON:  None. FINDINGS: The bowel gas pattern is normal. No radio-opaque calculi or other significant radiographic abnormality are seen. IMPRESSION: No evidence of bowel obstruction or ileus. Electronically Signed   By: Lupita Raider, M.D.   On: 12/16/2015 21:19     ASSESSMENT AND PLAN:   45 year old male with acute pancreatitis  1. Acute pancreatitis: This is due to elevated triglyceride level as well as EtOH abuse. Continue aggressive IV fluids and supportive care. Try clear liquid diet and ADAT Continue gemfibrozil and fish oil for elevated triglyceride level. Check CT ABD if ongoing pain.  2. Hypertriglyceridemia: Continue gemfibrozil and fish oil.   3. Essential  hypertension: Continue metoprolol and losartan. HCTZ discontinued due to low sodium level and pancreatitis. Can restart tomorrow. 4. EtOH abuse: Continue CIWA protocol. No signs of active withdrawal.  5. Constipation: Laxatives   Management plans discussed with the patient and he is in agreement.  CODE STATUS: FULL  TOTAL TIME TAKING CARE OF THIS PATIENT: 27 minutes.     POSSIBLE D/C today or tomorow, DEPENDING ON CLINICAL CONDITION.   Ellanor Feuerstein M.D on 12/18/2015 at 3:26 PM  Between 7am to 6pm - Pager - [phone] After 6pm go to [email or web address] - password [email or web address] [email or web address]  [facility] [place] [facility]  Office  [phone]  CC: Primary care physician; Anola Gurneyobert Chauvin, PA  Note: This dictation was prepared with Dragon dictation along with smaller phrase technology. Any transcriptional errors that result from this process are unintentional.

## 2015-12-18 NOTE — Telephone Encounter (Signed)
[facility] for Pancreat[facility].  I have scheduled a follow up appointment/MW

## 2015-12-18 NOTE — Plan of Care (Signed)
Problem: Food- and Nutrition-Related Knowledge Deficit (NB-1.1) Goal: Nutrition education Formal process to instruct or train a patient/client in a skill or to impart knowledge to help patients/clients voluntarily manage or modify food choices and eating behavior to maintain or improve health. Outcome: Completed/Met Date Met:  12/18/15    Nutrition Education Note  RD consulted for nutrition education regarding pancreatitis and Low Fat Nutrition therapy.  RD provided "Pancreatitis Nutrition Therapy" handout from the Academy of [employer] and Dietetics. Discussed nutrition therapy to reduce the irritation of the pancreas to promote digestion and absorption of nutrients. Provided list of recommended low fat foods as well as a recommended sample day. Recommended increasing fresh fruits and vegetables and healthy fiber foods as well such as whole grain breads.  Teach back method used, family present.  Expect good compliance.  Body mass index is 33.57 kg/(m^2).   Current diet order is Soft, patient is consuming approximately 100% of lunch today. Pt reports eating mashed potatoes, meat and pears.  Pt reports some abdominal pain on visit; RD notes CT of abdomen ordered. Discharge orders placed/pending. Labs and medications reviewed. No further nutrition interventions warranted at this time. RD contact information provided. If additional nutrition issues arise, please re-consult RD.   Dwyane Luo, RD, LDN Pager [phone] Weekend/On-Call Pager [phone]

## 2015-12-18 NOTE — Progress Notes (Signed)
CT no new changes. No absess or pseudcyst Try clear liquid diet and treat constipation

## 2015-12-18 NOTE — Telephone Encounter (Signed)
FYI. KW 

## 2015-12-19 LAB — BASIC METABOLIC PANEL
ANION GAP: 9 (ref 5–15)
BUN: 9 mg/dL (ref 6–20)
CALCIUM: 8.4 mg/dL — AB (ref 8.9–10.3)
CO2: 24 mmol/L (ref 22–32)
Chloride: 103 mmol/L (ref 101–111)
Creatinine, Ser: 0.81 mg/dL (ref 0.61–1.24)
GLUCOSE: 108 mg/dL — AB (ref 65–99)
Potassium: 3.6 mmol/L (ref 3.5–5.1)
SODIUM: 136 mmol/L (ref 135–145)

## 2015-12-19 NOTE — Progress Notes (Signed)
Discussed discharge instructions and medications with pt.  IV removed. Prescription and follow up appointments given.  All questions answered.  Pt transported home via car by mother.  Orson Apeanielle Erie Sica, RN

## 2015-12-19 NOTE — Discharge Instructions (Signed)
Authorized Agent Controlled Analgesia °Authorized agent controlled analgesia (AACA) is when a family member or someone who cares for an individual gives pain relieving medication (analgesia). The pain medication is given through a device called a patient controlled analgesia (PCA) pump. When a person cannot administer the analgesia through the PCA pump or does not understand when to give analgesia, another person is given this responsibility. They are referred to as an authorized agent. The authorized agent needs to be approved, trained, and responsible to administer a person's analgesia through a PCA pump. One authorized agent will be allowed to dose the patient during a specified time frame. Other family members or visitors should not administer analgesia. °AACA ANALGESIA DOSING °Together, you and the nurse will select a pain scale to use as well as a comfort goal. This may be a number scale or may be based on behaviors of your loved one. The goal of pain management is to make your loved one as comfortable as possible and to reduce pain without making them too sleepy. Reduced pain, not total relief, is a realistic plan. An analgesia dose should not be given if your loved one is sleeping or for reasons other than pain relief, unless directed otherwise.  °When your loved one has pain: °· Activate the delivery of an analgesia dose as instructed. °· Listen for an audible tone. This tone lets you know that the pump received the signal to give the analgesia. The analgesia is delivered through the attached intravenous (IV) tubing and into your loved one. °Doses of analgesia should be attempted anytime your loved one is in pain and awake. However, your loved one may not get a dose of analgesia every time you activate dose delivery. The pump is programmed to a specific time interval between doses. This safety feature attempts to prevent overdosing.  °Nurses will continue to monitor your loved one for pain relief, side  effects, and complications during AACA. Drowsiness, sedation, dizziness, nausea, vomiting, and constipation are some of the side effects of analgesia. Complications of analgesia include slowed breathing, tolerance, and dependence. °Use of analgesics may raise worries about possible dependence or addiction. The short-term use of analgesics will be managed carefully to make sure the chance of dependence is as low as possible. Most patients progress to oral pain relieving medications within a couple days, and the use of AACA is discontinued. °BENEFITS OF AACA  °· Manages pain effectively. °· Minimizes the delay in the patient getting the pain medication they need. °· Reduces number of injections for pain relief. °· Promotes shorter hospital stays. Patients with good pain control get better faster and usually leave the hospital earlier. °SEEK MEDICAL CARE IF: °· You have questions or concerns about AACA. °· There is swelling or leaking at the IV insertion site. °· The skin near the IV insertion site is cool to the touch. °· Your loved one has pain that does not get better with the analgesia. °· Your loved one is dizzy. °· Your loved one has nausea or is vomiting. °· Your loved one is constipated. °SEEK IMMEDIATE MEDICAL CARE IF: °· Your loved one, who was previously awake, cannot be aroused. °· Your loved one is breathing slowly. °· Your loved one is itching. °  °This information is not intended to replace advice given to you by your health care provider. Make sure you discuss any questions you have with your health care provider. °  °Document Released: 11/20/2010 Document Revised: 10/28/2011 Document Reviewed: 11/20/2010 °Elsevier Interactive Patient Education ©2016 Elsevier   Inc. ° °

## 2015-12-20 ENCOUNTER — Other Ambulatory Visit: Payer: Self-pay | Admitting: Family Medicine

## 2015-12-20 DIAGNOSIS — M5412 Radiculopathy, cervical region: Secondary | ICD-10-CM

## 2015-12-20 MED ORDER — CYCLOBENZAPRINE HCL 10 MG PO TABS: ORAL_TABLET | ORAL | Status: DC

## 2015-12-21 ENCOUNTER — Ambulatory Visit: Payer: BLUE CROSS/BLUE SHIELD | Admitting: Family Medicine

## 2015-12-25 ENCOUNTER — Encounter: Payer: Self-pay | Admitting: Family Medicine

## 2015-12-25 ENCOUNTER — Ambulatory Visit (INDEPENDENT_AMBULATORY_CARE_PROVIDER_SITE_OTHER): Payer: BLUE CROSS/BLUE SHIELD | Admitting: Family Medicine

## 2015-12-25 VITALS — BP 104/70 | HR 73 | Temp 98.6°F | Resp 16 | Wt 243.4 lb

## 2015-12-25 DIAGNOSIS — K8521 Alcohol induced acute pancreatitis with uninfected necrosis: Secondary | ICD-10-CM

## 2015-12-25 DIAGNOSIS — L858 Other specified epidermal thickening: Secondary | ICD-10-CM

## 2015-12-25 DIAGNOSIS — E781 Pure hyperglyceridemia: Secondary | ICD-10-CM

## 2015-12-25 DIAGNOSIS — K852 Alcohol induced acute pancreatitis without necrosis or infection: Secondary | ICD-10-CM

## 2015-12-25 NOTE — Progress Notes (Signed)
Subjective:     Patient ID: Javier Griffin, male   DOB: 12/24/1970, 45 y.o.   MRN: [id number]  HPI  Chief Complaint  Patient presents with  . Hospitalization Follow-up    Patient is present in office today for hospital follow up after being [facility] with complaints of flank pain. CT can in ER was consistant with pancreatitis;patient was prescribed Tylenol #2 300-15mg  QID , Lopid 600mg  BID and Omega 3 1 gram BID.    States he has not resumed alcohol. Reports on the day it happened he had eaten bacon,fried foods and drank alcohol. Tolerating new medication well. Also has persistent itchy bumps on his flanks, upper arms, lower abdomen and thighs. He felt this was left over poison ivy from o.v. Of 4/6 as has been using steroid cream. He states Dr Lovell SheehanJenkins is scheduling him for more neck surgery.   Review of Systems     Objective:   Physical Exam  Constitutional: He appears well-developed and well-nourished. No distress.  Abdominal: Soft. There is no tenderness.  Skin:  2-3 mm skin colored to erythematous papules on his flanka, upper arms, lower abdomen, and thighs.       Assessment:    1. Acute alcoholic pancreatitis: will update lab at time of lipids. - Lipase  2. Hyperglyceridemia: f/u  triglycerides on Lopid and Omega 3 fish oil. - Lipid panel  3. Keratosis pilaris rubra    Plan:    Discussed using steroid cream with moisturizer cream to rash. Consider dermatology referral. Stop piroxicam as not helping neck pain. Stop alcohol. Further f/u pending labs in 3 weeks.

## 2015-12-25 NOTE — Patient Instructions (Addendum)
Discussed use of moisturizing cream along with steroid cream applied in equal amounts 2x day. Stop piroxicam if not helping. Stop alcohol. Get labs in 3 weeks before you run out of medication.

## 2016-01-09 ENCOUNTER — Other Ambulatory Visit: Payer: Self-pay | Admitting: Neurosurgery

## 2016-01-10 ENCOUNTER — Other Ambulatory Visit: Payer: Self-pay | Admitting: Family Medicine

## 2016-01-10 DIAGNOSIS — I10 Essential (primary) hypertension: Secondary | ICD-10-CM

## 2016-01-10 MED ORDER — METOPROLOL SUCCINATE ER 50 MG PO TB24: 50.0000 mg | ORAL_TABLET | Freq: Every day | ORAL | Status: DC

## 2016-01-30 ENCOUNTER — Ambulatory Visit (INDEPENDENT_AMBULATORY_CARE_PROVIDER_SITE_OTHER): Payer: BLUE CROSS/BLUE SHIELD | Admitting: Family Medicine

## 2016-01-30 VITALS — BP 130/80 | HR 72 | Temp 98.5°F | Resp 16 | Ht 72.0 in | Wt 243.8 lb

## 2016-01-30 DIAGNOSIS — E785 Hyperlipidemia, unspecified: Secondary | ICD-10-CM

## 2016-01-30 DIAGNOSIS — Z8719 Personal history of other diseases of the digestive system: Secondary | ICD-10-CM

## 2016-01-30 DIAGNOSIS — R21 Rash and other nonspecific skin eruption: Secondary | ICD-10-CM | POA: Diagnosis not present

## 2016-01-30 NOTE — Patient Instructions (Signed)
We will call you with the lab results. Try Claritin during the day and Benadryl at night for itching. May wish to hold the muscle relaxant if taking the Benadryl to minimize sedation.

## 2016-01-30 NOTE — Progress Notes (Signed)
Subjective:     Patient ID: Javier Griffin, male   DOB: 05/09/1971, 45 y.o.   MRN: [id number]  HPI  Chief Complaint  Patient presents with  . Rash    onset month getting worst and spreading itchy   Here in f/u of o.v.of 5/8. At that time felt to have rash c/w keratosis pilaris. Now has numerous pruritic papules on his back, buttocks, upper legs and upper arms. Chest is spared. States he has used steroid cream and Sarna with transient improvement in itching. Has stopped Lopid and fish oil rx;ed after recent hospitalization for pancreatitis with markedly elevated triglycerides. Did not get labs done as requested. He has further neck surgery scheduled for late July.   Review of Systems     Objective:   Physical Exam  Constitutional: He appears well-developed and well-nourished. No distress.  Skin:  numerous 3-4 mm. Erythematous to flesh colored papules in the distribution noted above. No vesicles or pustules noted.       Assessment:    1. Rash: check LFT's - Comprehensive metabolic panel  2. HLD (hyperlipidemia): lipid profile  3. History of pancreatitis: lipase     Plan:    Consider dermatology referral, prednisone trial pending review of labs.

## 2016-01-31 ENCOUNTER — Other Ambulatory Visit: Payer: Self-pay | Admitting: Family Medicine

## 2016-01-31 DIAGNOSIS — E781 Pure hyperglyceridemia: Secondary | ICD-10-CM

## 2016-01-31 DIAGNOSIS — R21 Rash and other nonspecific skin eruption: Secondary | ICD-10-CM

## 2016-01-31 LAB — COMPREHENSIVE METABOLIC PANEL
ALK PHOS: 70 IU/L (ref 39–117)
ALT: 56 IU/L — AB (ref 0–44)
AST: 48 IU/L — ABNORMAL HIGH (ref 0–40)
Albumin/Globulin Ratio: 1.5 (ref 1.2–2.2)
Albumin: 4.9 g/dL (ref 3.5–5.5)
BILIRUBIN TOTAL: 0.3 mg/dL (ref 0.0–1.2)
BUN/Creatinine Ratio: 12 (ref 9–20)
BUN: 12 mg/dL (ref 6–24)
CHLORIDE: 97 mmol/L (ref 96–106)
CO2: 21 mmol/L (ref 18–29)
Calcium: 10.1 mg/dL (ref 8.7–10.2)
Creatinine, Ser: 0.97 mg/dL (ref 0.76–1.27)
GFR calc non Af Amer: 95 mL/min/{1.73_m2} (ref >59)
GFR, EST AFRICAN AMERICAN: 110 mL/min/{1.73_m2} (ref >59)
GLUCOSE: 100 mg/dL — AB (ref 65–99)
Globulin, Total: 3.2 g/dL (ref 1.5–4.5)
Potassium: 5.1 mmol/L (ref 3.5–5.2)
Sodium: 143 mmol/L (ref 134–144)
TOTAL PROTEIN: 8.1 g/dL (ref 6.0–8.5)

## 2016-01-31 LAB — LIPID PANEL
Chol/HDL Ratio: 14.2 ratio units — ABNORMAL HIGH (ref 0.0–5.0)
Cholesterol, Total: 497 mg/dL — ABNORMAL HIGH (ref 100–199)
HDL: 35 mg/dL — ABNORMAL LOW (ref >39)
Triglycerides: 3429 mg/dL (ref 0–149)

## 2016-01-31 LAB — LIPASE: LIPASE: 34 U/L (ref 0–59)

## 2016-01-31 MED ORDER — PREDNISONE 10 MG PO TABS: ORAL_TABLET | ORAL | Status: DC

## 2016-01-31 MED ORDER — GEMFIBROZIL 600 MG PO TABS: 600.0000 mg | ORAL_TABLET | Freq: Two times a day (BID) | ORAL | Status: DC

## 2016-02-26 ENCOUNTER — Other Ambulatory Visit: Payer: Self-pay | Admitting: Family Medicine

## 2016-02-26 DIAGNOSIS — E781 Pure hyperglyceridemia: Secondary | ICD-10-CM

## 2016-02-26 MED ORDER — GEMFIBROZIL 600 MG PO TABS: 600.0000 mg | ORAL_TABLET | Freq: Two times a day (BID) | ORAL | Status: DC

## 2016-02-28 ENCOUNTER — Other Ambulatory Visit: Payer: Self-pay | Admitting: Family Medicine

## 2016-02-29 ENCOUNTER — Inpatient Hospital Stay (HOSPITAL_COMMUNITY): Admission: RE | Admit: 2016-02-29 | Payer: Self-pay | Source: Ambulatory Visit

## 2016-03-05 ENCOUNTER — Other Ambulatory Visit (HOSPITAL_COMMUNITY): Payer: Self-pay

## 2016-03-11 ENCOUNTER — Other Ambulatory Visit: Payer: Self-pay | Admitting: Neurosurgery

## 2016-03-13 ENCOUNTER — Encounter (HOSPITAL_COMMUNITY): Admission: RE | Payer: Self-pay | Source: Ambulatory Visit

## 2016-03-13 ENCOUNTER — Ambulatory Visit (HOSPITAL_COMMUNITY): Admission: RE | Admit: 2016-03-13 | Payer: Worker's Compensation | Source: Ambulatory Visit | Admitting: Neurosurgery

## 2016-03-13 SURGERY — ANTERIOR CERVICAL DECOMPRESSION/DISCECTOMY FUSION 1 LEVEL
Anesthesia: General

## 2016-03-18 ENCOUNTER — Other Ambulatory Visit: Payer: Self-pay | Admitting: Family Medicine

## 2016-03-18 DIAGNOSIS — J301 Allergic rhinitis due to pollen: Secondary | ICD-10-CM

## 2016-03-18 NOTE — Telephone Encounter (Signed)
Review. KW 

## 2016-03-21 ENCOUNTER — Other Ambulatory Visit: Payer: Self-pay | Admitting: Family Medicine

## 2016-03-21 DIAGNOSIS — J301 Allergic rhinitis due to pollen: Secondary | ICD-10-CM

## 2016-03-21 MED ORDER — FLUTICASONE PROPIONATE 50 MCG/ACT NA SUSP
2.0000 | Freq: Every day | NASAL | 1 refills | Status: DC
Start: 2016-03-21 — End: 2016-10-16

## 2016-03-25 ENCOUNTER — Other Ambulatory Visit: Payer: Self-pay | Admitting: Family Medicine

## 2016-03-27 NOTE — Telephone Encounter (Signed)
error 

## 2016-04-01 ENCOUNTER — Other Ambulatory Visit: Payer: Self-pay | Admitting: Family Medicine

## 2016-04-01 DIAGNOSIS — K219 Gastro-esophageal reflux disease without esophagitis: Secondary | ICD-10-CM

## 2016-04-08 ENCOUNTER — Inpatient Hospital Stay (HOSPITAL_COMMUNITY): Admission: RE | Admit: 2016-04-08 | Payer: Self-pay | Source: Ambulatory Visit

## 2016-04-08 ENCOUNTER — Other Ambulatory Visit (HOSPITAL_COMMUNITY): Payer: Self-pay

## 2016-04-15 ENCOUNTER — Encounter (HOSPITAL_COMMUNITY): Admission: RE | Payer: Self-pay | Source: Ambulatory Visit

## 2016-04-15 ENCOUNTER — Ambulatory Visit (HOSPITAL_COMMUNITY): Admission: RE | Admit: 2016-04-15 | Payer: BLUE CROSS/BLUE SHIELD | Source: Ambulatory Visit | Admitting: Neurosurgery

## 2016-04-15 SURGERY — ANTERIOR CERVICAL DECOMPRESSION/DISCECTOMY FUSION 1 LEVEL
Anesthesia: General

## 2016-04-17 ENCOUNTER — Telehealth: Payer: Self-pay | Admitting: Family Medicine

## 2016-04-17 ENCOUNTER — Other Ambulatory Visit: Payer: Self-pay | Admitting: Family Medicine

## 2016-04-17 DIAGNOSIS — R21 Rash and other nonspecific skin eruption: Secondary | ICD-10-CM

## 2016-04-17 MED ORDER — PREDNISONE 10 MG PO TABS: ORAL_TABLET | ORAL | 0 refills | Status: DC

## 2016-04-17 NOTE — Telephone Encounter (Signed)
Have sent in prednisone. If not improved to come in for o.v.

## 2016-04-17 NOTE — Telephone Encounter (Signed)
Please advise if you want patient to come in for evaluation. KW

## 2016-04-17 NOTE — Telephone Encounter (Signed)
Pt called saying he has a rash that has broke out all over.  You have treated him before for with prednisone.  He wants to know if you can refill that prescription.  He called it a heat rash.  [facility]  [address]  Call back is [phone]  Thanks Barth Kirkseri

## 2016-04-17 NOTE — Telephone Encounter (Signed)
Patient has been advised. KW 

## 2016-05-16 IMAGING — DX DG CERVICAL SPINE 2 OR 3 VIEWS
2 series · 2 of 2 positions shown · non-contrast
Comparison: CT scan dated 12/09/2014

CLINICAL DATA: Hardware removal of T1 pedicle screw.

EXAM:
CERVICAL SPINE - 2-3 VIEW

[lat]
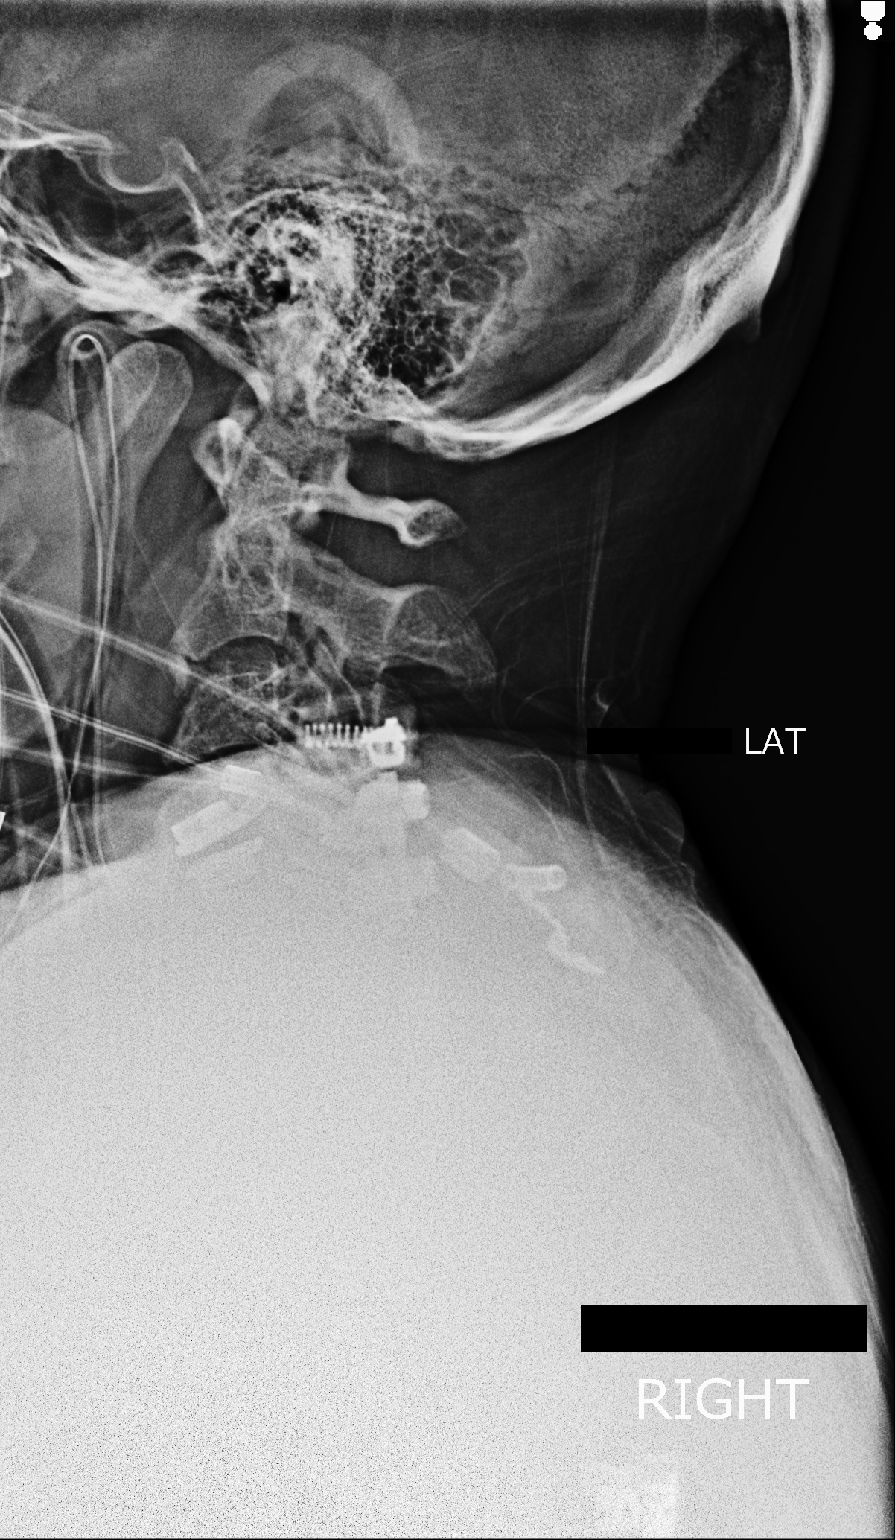

[ap]
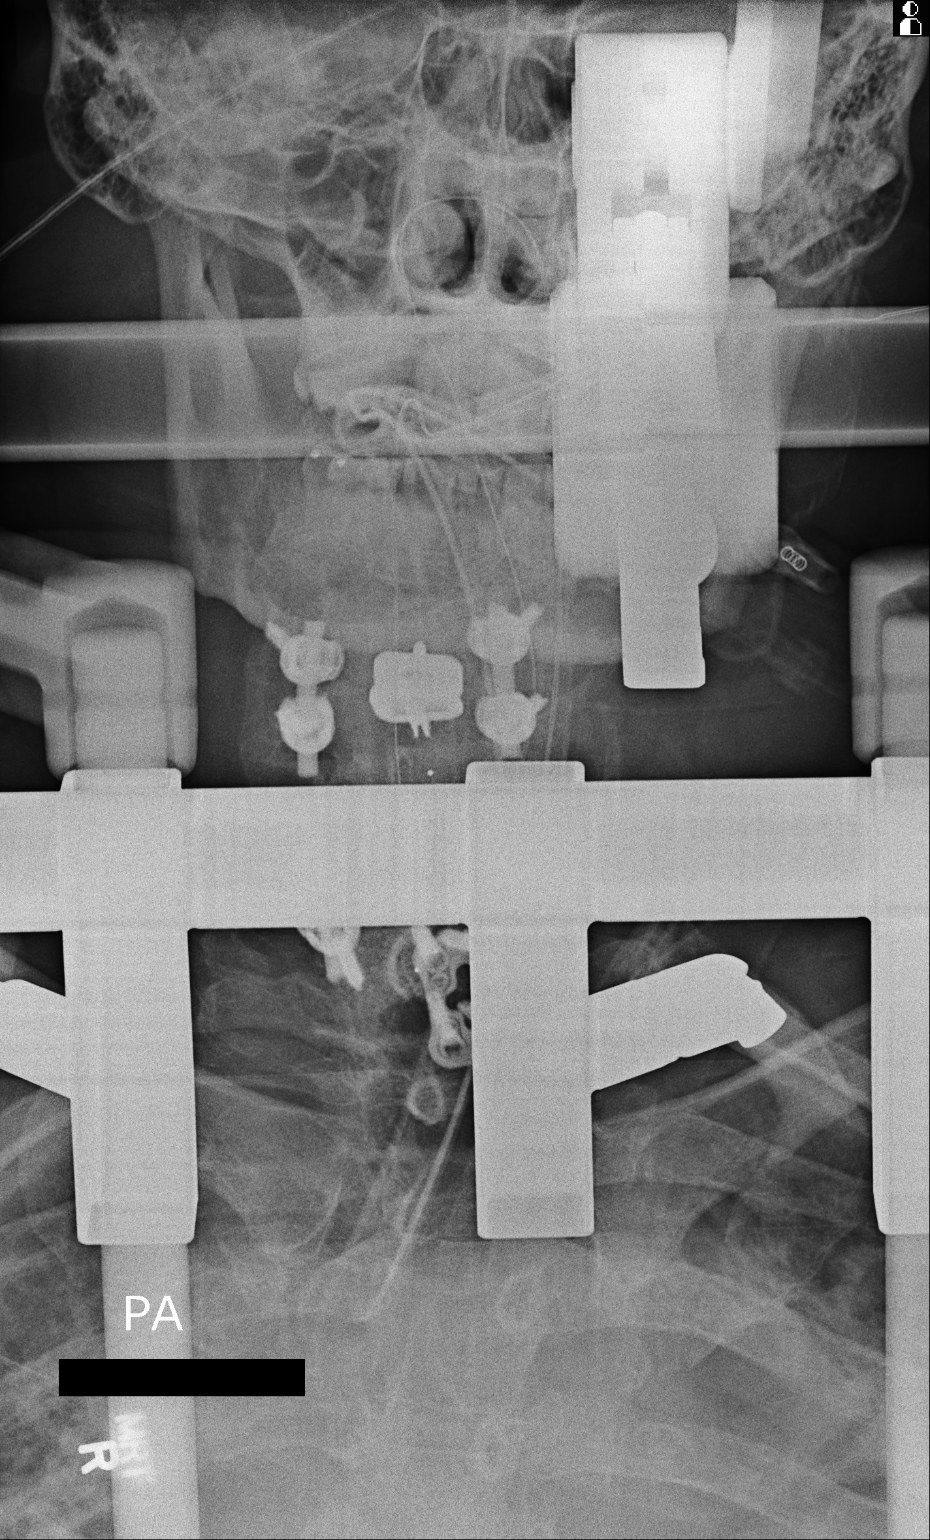

[2 of 2 positions shown; findings below may reference images not displayed]

FINDINGS: PA and lateral portable images of the cervical spine demonstrate
hardware C4-5 on the lateral view and hardware at C4-5 and at C7-T1
in the PA projection. Surgical fixation devices obscure the detail
of the pedicle screws at C7.
IMPRESSION: C7 pedicle screws are only partially visualized on the available
images. No screws appear to have been removed at the time of these
images.

## 2016-05-25 ENCOUNTER — Other Ambulatory Visit: Payer: Self-pay | Admitting: Family Medicine

## 2016-05-25 DIAGNOSIS — E781 Pure hyperglyceridemia: Secondary | ICD-10-CM

## 2016-05-29 NOTE — Telephone Encounter (Signed)
Patient advised. Patient scheduled for a follow up appointment,

## 2016-05-29 NOTE — Telephone Encounter (Signed)
Patient was supposed have lipids checked after restarting medication. His last triglycerides were over 3000. Have sent one refill prescription to pharmacy, but needs to get lipids and CBC checked in the next week or two.

## 2016-05-29 NOTE — Telephone Encounter (Signed)
Provider is out of office can you please review over request? Thank you. KW 

## 2016-06-04 ENCOUNTER — Other Ambulatory Visit: Payer: Self-pay | Admitting: Neurosurgery

## 2016-06-05 ENCOUNTER — Encounter: Payer: Self-pay | Admitting: Family Medicine

## 2016-06-05 ENCOUNTER — Ambulatory Visit (INDEPENDENT_AMBULATORY_CARE_PROVIDER_SITE_OTHER): Payer: BLUE CROSS/BLUE SHIELD | Admitting: Family Medicine

## 2016-06-05 VITALS — BP 110/74 | HR 82 | Temp 98.5°F | Resp 16 | Wt 244.0 lb

## 2016-06-05 DIAGNOSIS — I10 Essential (primary) hypertension: Secondary | ICD-10-CM | POA: Diagnosis not present

## 2016-06-05 DIAGNOSIS — E781 Pure hyperglyceridemia: Secondary | ICD-10-CM | POA: Diagnosis not present

## 2016-06-05 DIAGNOSIS — E782 Mixed hyperlipidemia: Secondary | ICD-10-CM | POA: Diagnosis not present

## 2016-06-05 MED ORDER — PREDNISONE 20 MG PO TABS: ORAL_TABLET | ORAL | 0 refills | Status: DC

## 2016-06-05 NOTE — Progress Notes (Signed)
Patient ID: Javier Griffin, male   DOB: 10/30/1970, 45 y.o.   MRN: [id number]

## 2016-06-05 NOTE — Progress Notes (Signed)
Subjective:     Patient ID: Javier Griffin, male   DOB: 07/31/1971, 45 y.o.   MRN: [id number]  HPI  Chief Complaint  Patient presents with  . Hyperlipidemia    Patient comes back in office today to follow up for abnormal lab screening on 01/30/16. Patient Triglycerides level was at 3,429.   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 Told to f/u with us and treated with topical steroid cream. Repeat course of oral steroids recommended per patient as they continue to itch. He has just resumed Lopid again in the last two weeks. He has discontinued alcohol use and has had no further abdominal pain.   Review of Systems     Objective:   Physical Exam  Constitutional: He appears well-developed and well-nourished. No distress.  Skin:  Numerous discrete papules on his trunk c/w his diagnosis.       Assessment:    1. Eruptive xanthoma - predniSONE (DELTASONE) 20 MG tablet; Taper as follows: 3 pills for 4 days, two pills for 4 days, one pill for four days  Dispense: 24 tablet; Refill: 0  2. Hypertriglyceridemia - Lipid panel  3. Essential hypertension - Comprehensive metabolic panel    Plan:    Further f/u pending lab work.

## 2016-06-05 NOTE — Patient Instructions (Signed)
We will call you with the lab results. Have the pharmacist sent refill order for the cream.

## 2016-06-06 ENCOUNTER — Telehealth: Payer: Self-pay

## 2016-06-06 ENCOUNTER — Other Ambulatory Visit: Payer: Self-pay | Admitting: Family Medicine

## 2016-06-06 DIAGNOSIS — E781 Pure hyperglyceridemia: Secondary | ICD-10-CM

## 2016-06-06 LAB — LIPID PANEL
CHOLESTEROL TOTAL: 275 mg/dL — AB (ref 100–199)
Chol/HDL Ratio: 12 ratio units — ABNORMAL HIGH (ref 0.0–5.0)
HDL: 23 mg/dL — AB (ref >39)
TRIGLYCERIDES: 1098 mg/dL — AB (ref 0–149)

## 2016-06-06 LAB — COMPREHENSIVE METABOLIC PANEL
ALK PHOS: 50 IU/L (ref 39–117)
ALT: 64 IU/L — AB (ref 0–44)
AST: 52 IU/L — AB (ref 0–40)
Albumin/Globulin Ratio: 1.6 (ref 1.2–2.2)
Albumin: 4.9 g/dL (ref 3.5–5.5)
BUN/Creatinine Ratio: 19 (ref 9–20)
BUN: 17 mg/dL (ref 6–24)
Bilirubin Total: 0.4 mg/dL (ref 0.0–1.2)
CALCIUM: 10 mg/dL (ref 8.7–10.2)
CO2: 26 mmol/L (ref 18–29)
CREATININE: 0.89 mg/dL (ref 0.76–1.27)
Chloride: 95 mmol/L — ABNORMAL LOW (ref 96–106)
GFR calc Af Amer: 121 mL/min/{1.73_m2} (ref >59)
GFR, EST NON AFRICAN AMERICAN: 105 mL/min/{1.73_m2} (ref >59)
GLOBULIN, TOTAL: 3 g/dL (ref 1.5–4.5)
GLUCOSE: 102 mg/dL — AB (ref 65–99)
Potassium: 4.2 mmol/L (ref 3.5–5.2)
SODIUM: 140 mmol/L (ref 134–144)
Total Protein: 7.9 g/dL (ref 6.0–8.5)

## 2016-06-06 MED ORDER — GEMFIBROZIL 600 MG PO TABS: 600.0000 mg | ORAL_TABLET | Freq: Two times a day (BID) | ORAL | 5 refills | Status: DC

## 2016-06-06 NOTE — Telephone Encounter (Signed)
-----   Message from Anola Gurneyobert Chauvin, [place] sent at 06/06/2016  9:38 AM EDT ----- Triglyceride levels have improved a lot off alcohol and on Lopid but still hitting 1000.Discussed with Dr. Sherrie MustacheFisher: would add back Omega 3 Fish Oil 2 grams twice daily along with the gemfibrozil.. We can recheck the labs the week before your surgery. Just call for the lab slip.

## 2016-06-06 NOTE — Telephone Encounter (Signed)
Patient has been advised. KW 

## 2016-06-06 NOTE — Telephone Encounter (Signed)
Left message to call back  

## 2016-06-06 NOTE — Telephone Encounter (Signed)
Pt is returning call.  ZO#[phone]

## 2016-06-08 ENCOUNTER — Other Ambulatory Visit: Payer: Self-pay | Admitting: Family Medicine

## 2016-06-08 DIAGNOSIS — E781 Pure hyperglyceridemia: Secondary | ICD-10-CM

## 2016-06-08 MED ORDER — GEMFIBROZIL 600 MG PO TABS: 600.0000 mg | ORAL_TABLET | Freq: Two times a day (BID) | ORAL | 0 refills | Status: DC

## 2016-06-10 ENCOUNTER — Other Ambulatory Visit: Payer: Self-pay | Admitting: Family Medicine

## 2016-06-10 DIAGNOSIS — M5412 Radiculopathy, cervical region: Secondary | ICD-10-CM

## 2016-06-25 NOTE — Pre-Procedure Instructions (Signed)
Tiburcio BashMark B Garms  06/25/2016      [id number] [place], [place] - [address] [name] [place] [address] Phone: [phone] Fax: [phone]    Your procedure is scheduled on Thursday, November 16.  [facility] Ad[facility] at 5:30 AM                Your surgery or procedure is scheduled for 7:30 AM   Call this number if you have problems the morning of surgery: [phone]                 For any other questions, please call [phone], Monday - Friday 8 AM - 4 PM.     Remember:  Do not eat food or drink liquids after midnight Wednesday, November 15.  Take these medicines the morning of surgery with A SIP OF WATER: gabapentin (NEURONTIN), metoprolol succinate (TOPROL-XL).                If needed:lansoprazole (PREVACID), cyclobenzaprine (FLEXERIL), fluticasone (FLONASE.                 Stop taking Aspirin, Aspirin Products, Vitamins herbal medications.  Do not take any NSAIDS ie:  Ibuprofen, Advil, Naproxen.                      [facility] Preparing For Surgery  Before surgery, you can play an important role. Because skin is not sterile, your skin needs to be as free of germs as possible. You can reduce the number of germs on your skin by washing with CHG (chlorahexidine gluconate) Soap before surgery.  CHG is an antiseptic cleaner which kills germs and bonds with the skin to continue killing germs even after washing.  Please do not use if you have an allergy to CHG or antibacterial soaps. If your skin becomes reddened/irritated stop using the CHG.  Do not shave (including legs and underarms) for at least 48 hours prior to first CHG shower. It is OK to shave your face.  Please follow these instructions carefully.   1. Shower the NIGHT BEFORE SURGERY and the MORNING OF SURGERY with CHG.   2. If you chose to wash your hair, wash your hair first as usual with your normal shampoo.  3. After you shampoo, rinse your hair and body  thoroughly to remove the shampoo.  4. Use CHG as you would any other liquid soap. You can apply CHG directly to the skin and wash gently with a scrungie or a clean washcloth.   5. Apply the CHG Soap to your body ONLY FROM THE NECK DOWN.  Do not use on open wounds or open sores. Avoid contact with your eyes, ears, mouth and genitals (private parts). Wash genitals (private parts) with your normal soap.  6. Wash thoroughly, paying special attention to the area where your surgery will be performed.  7. Thoroughly rinse your body with warm water from the neck down.  8. DO NOT shower/wash with your normal soap after using and rinsing off the CHG Soap.  9. Pat yourself dry with a CLEAN TOWEL.   10. Wear CLEAN PAJAMAS   11. Place CLEAN SHEETS on your bed the night of your first shower and DO NOT SLEEP WITH PETS.  Day of Surgery: Do not apply any deodorants/lotions. Please wear clean clothes to the hospital/surgery center.      Do not wear jewelry, make-up or nail polish.  Do not wear lotions, powders, or perfumes, or deodorant.    Men may shave face and neck.  Do not bring valuables to the hospital.  [facility] is not responsible for any belongings or valuables.  Contacts, dentures or bridgework may not be worn into surgery.  Leave your suitcase in the car.  After surgery it may be brought to your room.  For patients admitted to the hospital, discharge time will be determined by your treatment team.  Patients discharged the day of surgery will not be allowed to drive home.   Special instructions:    Please read over the following fact sheets that you were  Mupirocin Application given:  Patient Instructions for Mupirocin Application, Coughing and Deep Breathing, Pain Booklet.

## 2016-06-26 ENCOUNTER — Encounter (HOSPITAL_COMMUNITY)
Admission: RE | Admit: 2016-06-26 | Discharge: 2016-06-26 | Disposition: A | Discharge status: Home / Self Care | Payer: BLUE CROSS/BLUE SHIELD | Source: Ambulatory Visit | Attending: Neurosurgery | Admitting: Neurosurgery

## 2016-06-26 ENCOUNTER — Encounter (HOSPITAL_COMMUNITY): Payer: Self-pay

## 2016-06-26 DIAGNOSIS — Z01818 Encounter for other preprocedural examination: Secondary | ICD-10-CM | POA: Insufficient documentation

## 2016-06-26 DIAGNOSIS — I517 Cardiomegaly: Secondary | ICD-10-CM | POA: Diagnosis not present

## 2016-06-26 DIAGNOSIS — Z01812 Encounter for preprocedural laboratory examination: Secondary | ICD-10-CM | POA: Insufficient documentation

## 2016-06-26 DIAGNOSIS — I10 Essential (primary) hypertension: Secondary | ICD-10-CM | POA: Insufficient documentation

## 2016-06-26 LAB — COMPREHENSIVE METABOLIC PANEL
ALK PHOS: 42 U/L (ref 38–126)
ALT: 66 U/L — AB (ref 17–63)
ANION GAP: 13 (ref 5–15)
AST: 58 U/L — ABNORMAL HIGH (ref 15–41)
Albumin: 4.3 g/dL (ref 3.5–5.0)
BILIRUBIN TOTAL: 0.6 mg/dL (ref 0.3–1.2)
BUN: 16 mg/dL (ref 6–20)
CALCIUM: 9.4 mg/dL (ref 8.9–10.3)
CO2: 21 mmol/L — ABNORMAL LOW (ref 22–32)
CREATININE: 0.9 mg/dL (ref 0.61–1.24)
Chloride: 104 mmol/L (ref 101–111)
Glucose, Bld: 91 mg/dL (ref 65–99)
Potassium: 4.2 mmol/L (ref 3.5–5.1)
SODIUM: 138 mmol/L (ref 135–145)
TOTAL PROTEIN: 7.4 g/dL (ref 6.5–8.1)

## 2016-06-26 LAB — CBC
HEMATOCRIT: 39.5 % (ref 39.0–52.0)
HEMOGLOBIN: 13.9 g/dL (ref 13.0–17.0)
MCH: 30.8 pg (ref 26.0–34.0)
MCHC: 35.2 g/dL (ref 30.0–36.0)
MCV: 87.6 fL (ref 78.0–100.0)
Platelets: 186 10*3/uL (ref 150–400)
RBC: 4.51 MIL/uL (ref 4.22–5.81)
RDW: 12.9 % (ref 11.5–15.5)
WBC: 4.8 10*3/uL (ref 4.0–10.5)

## 2016-06-26 LAB — PROTIME-INR
INR: 0.83
PROTHROMBIN TIME: 11.4 s (ref 11.4–15.2)

## 2016-06-26 LAB — SURGICAL PCR SCREEN
MRSA, PCR: NEGATIVE
Staphylococcus aureus: POSITIVE — AB

## 2016-06-26 NOTE — Pre-Procedure Instructions (Addendum)
Javier Griffin  06/26/2016      [id number] [place] [facility], [place] - [address] [place] [place] [address] Phone: [phone] Fax: [phone]    Your procedure is scheduled on Thursday, November 16.  [facility] at 5:30 AM                Your Griffin or procedure is scheduled for 7:30 AM   Call this number if you have problems the morning of Griffin: [phone]                 For any other questions, please call [phone], Monday - Friday 8 AM - 4 PM.     Remember:  Do not eat food or drink liquids after midnight Wednesday, November 15.  Take these medicines the morning of Griffin with A SIP OF WATER: gabapentin (NEURONTIN), metoprolol succinate (TOPROL-XL).                If needed:lansoprazole (PREVACID), cyclobenzaprine (FLEXERIL), fluticasone (FLONASE.                 Stop tomorrow  taking Aspirin, Aspirin Products, Vitamins herbal medications.  Do not take any NSAIDS ie:  Ibuprofen, Advil, Naproxen.                      [facility]  Before Griffin, you can play an important role. Because skin is not sterile, your skin needs to be as free of germs as possible. You can reduce the number of germs on your skin by washing with CHG (chlorahexidine gluconate) Soap before Griffin.  CHG is an antiseptic cleaner which kills germs and bonds with the skin to continue killing germs even after washing.  Please do not use if you have an allergy to CHG or antibacterial soaps. If your skin becomes reddened/irritated stop using the CHG.  Do not shave (including legs and underarms) for at least 48 hours prior to first CHG shower. It is OK to shave your face.  Please follow these instructions carefully.   1. Shower the NIGHT BEFORE Griffin and the MORNING OF Griffin with CHG.   2. If you chose to wash your hair, wash your hair first as usual with your normal shampoo.  3. After you shampoo, rinse your hair and  body thoroughly to remove the shampoo.  4. Use CHG as you would any other liquid soap. You can apply CHG directly to the skin and wash gently with a scrungie or a clean washcloth.   5. Apply the CHG Soap to your body ONLY FROM THE NECK DOWN.  Do not use on open wounds or open sores. Avoid contact with your eyes, ears, mouth and genitals (private parts). Wash genitals (private parts) with your normal soap.  6. Wash thoroughly, paying special attention to the area where your Griffin will be performed.  7. Thoroughly rinse your body with warm water from the neck down.  8. DO NOT shower/wash with your normal soap after using and rinsing off the CHG Soap.  9. Pat yourself dry with a CLEAN TOWEL.   10. Wear CLEAN PAJAMAS   11. Place CLEAN SHEETS on your bed the night of your first shower and DO NOT SLEEP WITH PETS.  Day of Griffin: Do not apply any deodorants/lotions. Please wear clean clothes to the hospital/Griffin center.      Do not wear jewelry, make-up or nail  polish.  Do not wear lotions, powders, or perfumes, or deodorant.    Men may shave face and neck.  Do not bring valuables to the hospital.  [facility] is not responsible for any belongings or valuables.  Contacts, dentures or bridgework may not be worn into Griffin.  Leave your suitcase in the car.  After Griffin it may be brought to your room.  For patients admitted to the hospital, discharge time will be determined by your treatment team.  Patients discharged the day of Griffin will not be allowed to drive home.   Special instructions:    Please read over the following fact sheets that you were  Mupirocin Application given:  Patient Instructions for Mupirocin Application, Coughing and Deep Breathing, Pain Booklet.

## 2016-06-26 NOTE — Progress Notes (Signed)
Anesthesia Chart Review:  Pt is a 45 year old male scheduled for C3-4 ACDF, interbody prosthesis plate on 16/10/960411/16/2017 with Tressie StalkerJeffrey Jenkins, MD.   PMH includes:  HTN, GERD. Never smoker. BMI 33  Pt hospitalized 4/29-12/18/15 for alcohol-induced acute pancreatitis. Triglycerides 2800 at the time.  Last triglyceride result was 1100 on 06/05/16.   Medications include: gemfibrozil, hctz, prevacid, losartan, metoprolol  Preoperative labs reviewed.    EKG 06/26/16: NSR.   If no changes, I anticipate pt can proceed with surgery as scheduled.   Rica Mastngela Traycen Goyer, FNP-BC [facility]/Anesthesiology Phone: [phone] 06/26/2016 4:01 PM

## 2016-07-04 ENCOUNTER — Ambulatory Visit (HOSPITAL_COMMUNITY): Payer: BLUE CROSS/BLUE SHIELD | Admitting: Certified Registered"

## 2016-07-04 ENCOUNTER — Ambulatory Visit (HOSPITAL_COMMUNITY)
Admission: RE | Admit: 2016-07-04 | Discharge: 2016-07-05 | Disposition: A | Discharge status: Home / Self Care | Payer: BLUE CROSS/BLUE SHIELD | Source: Ambulatory Visit | Attending: Neurosurgery | Admitting: Neurosurgery

## 2016-07-04 ENCOUNTER — Ambulatory Visit (HOSPITAL_COMMUNITY): Payer: BLUE CROSS/BLUE SHIELD

## 2016-07-04 ENCOUNTER — Encounter (HOSPITAL_COMMUNITY)
Admission: RE | Disposition: A | Discharge status: Home / Self Care | Payer: Self-pay | Source: Ambulatory Visit | Attending: Neurosurgery

## 2016-07-04 ENCOUNTER — Ambulatory Visit (HOSPITAL_COMMUNITY): Payer: BLUE CROSS/BLUE SHIELD | Admitting: Vascular Surgery

## 2016-07-04 DIAGNOSIS — Z888 Allergy status to other drugs, medicaments and biological substances status: Secondary | ICD-10-CM | POA: Diagnosis not present

## 2016-07-04 DIAGNOSIS — K219 Gastro-esophageal reflux disease without esophagitis: Secondary | ICD-10-CM | POA: Diagnosis not present

## 2016-07-04 DIAGNOSIS — Z825 Family history of asthma and other chronic lower respiratory diseases: Secondary | ICD-10-CM | POA: Diagnosis not present

## 2016-07-04 DIAGNOSIS — M4722 Other spondylosis with radiculopathy, cervical region: Secondary | ICD-10-CM | POA: Diagnosis not present

## 2016-07-04 DIAGNOSIS — Z8249 Family history of ischemic heart disease and other diseases of the circulatory system: Secondary | ICD-10-CM | POA: Insufficient documentation

## 2016-07-04 DIAGNOSIS — I10 Essential (primary) hypertension: Secondary | ICD-10-CM | POA: Diagnosis not present

## 2016-07-04 DIAGNOSIS — M4802 Spinal stenosis, cervical region: Secondary | ICD-10-CM | POA: Diagnosis not present

## 2016-07-04 DIAGNOSIS — M5011 Cervical disc disorder with radiculopathy,  high cervical region: Secondary | ICD-10-CM | POA: Diagnosis not present

## 2016-07-04 DIAGNOSIS — Z801 Family history of malignant neoplasm of trachea, bronchus and lung: Secondary | ICD-10-CM | POA: Insufficient documentation

## 2016-07-04 DIAGNOSIS — Z419 Encounter for procedure for purposes other than remedying health state, unspecified: Secondary | ICD-10-CM

## 2016-07-04 HISTORY — PX: ANTERIOR CERVICAL DECOMP/DISCECTOMY FUSION: SHX1161

## 2016-07-04 SURGERY — ANTERIOR CERVICAL DECOMPRESSION/DISCECTOMY FUSION 1 LEVEL
Anesthesia: General | Site: Neck

## 2016-07-04 MED ORDER — CEFAZOLIN SODIUM-DEXTROSE 2-4 GM/100ML-% IV SOLN
INTRAVENOUS | Status: AC
  Filled 2016-07-04: qty 100

## 2016-07-04 MED ORDER — DOCUSATE SODIUM 100 MG PO CAPS
100.0000 mg | ORAL_CAPSULE | Freq: Two times a day (BID) | ORAL | Status: DC
  Administered 2016-07-04 (×2): 100 mg via ORAL
  Filled 2016-07-04 (×2): qty 1

## 2016-07-04 MED ORDER — SODIUM CHLORIDE 0.9 % IR SOLN
Status: DC | PRN
  Administered 2016-07-04: 09:00:00

## 2016-07-04 MED ORDER — CEFAZOLIN SODIUM-DEXTROSE 2-4 GM/100ML-% IV SOLN
2.0000 g | INTRAVENOUS | Status: AC
  Administered 2016-07-04: 2 g via INTRAVENOUS

## 2016-07-04 MED ORDER — ROCURONIUM BROMIDE 100 MG/10ML IV SOLN
INTRAVENOUS | Status: DC | PRN
  Administered 2016-07-04 (×2): 50 mg via INTRAVENOUS
  Administered 2016-07-04: 30 mg via INTRAVENOUS
  Administered 2016-07-04: 20 mg via INTRAVENOUS

## 2016-07-04 MED ORDER — OXYCODONE-ACETAMINOPHEN 5-325 MG PO TABS
ORAL_TABLET | ORAL | Status: AC
  Filled 2016-07-04: qty 2

## 2016-07-04 MED ORDER — FLUTICASONE PROPIONATE 50 MCG/ACT NA SUSP
2.0000 | Freq: Every day | NASAL | Status: DC | PRN
  Filled 2016-07-04: qty 16

## 2016-07-04 MED ORDER — MIDAZOLAM HCL 2 MG/2ML IJ SOLN
INTRAMUSCULAR | Status: AC
  Filled 2016-07-04: qty 2

## 2016-07-04 MED ORDER — LACTATED RINGERS IV SOLN: INTRAVENOUS | Status: DC

## 2016-07-04 MED ORDER — FENTANYL CITRATE (PF) 100 MCG/2ML IJ SOLN
INTRAMUSCULAR | Status: AC
  Filled 2016-07-04: qty 2

## 2016-07-04 MED ORDER — THROMBIN 5000 UNITS EX SOLR
CUTANEOUS | Status: AC
  Filled 2016-07-04: qty 5000

## 2016-07-04 MED ORDER — DEXAMETHASONE SODIUM PHOSPHATE 4 MG/ML IJ SOLN: 4.0000 mg | Freq: Four times a day (QID) | INTRAMUSCULAR | Status: AC

## 2016-07-04 MED ORDER — DEXAMETHASONE SODIUM PHOSPHATE 10 MG/ML IJ SOLN
INTRAMUSCULAR | Status: DC | PRN
  Administered 2016-07-04: 10 mg via INTRAVENOUS

## 2016-07-04 MED ORDER — CHLORHEXIDINE GLUCONATE CLOTH 2 % EX PADS: 6.0000 | MEDICATED_PAD | Freq: Once | CUTANEOUS | Status: DC

## 2016-07-04 MED ORDER — LOSARTAN POTASSIUM 50 MG PO TABS: 100.0000 mg | ORAL_TABLET | Freq: Every day | ORAL | Status: DC

## 2016-07-04 MED ORDER — HYDROCODONE-ACETAMINOPHEN 5-325 MG PO TABS: 1.0000 | ORAL_TABLET | ORAL | Status: DC | PRN

## 2016-07-04 MED ORDER — ONDANSETRON HCL 4 MG/2ML IJ SOLN
INTRAMUSCULAR | Status: DC | PRN
  Administered 2016-07-04: 4 mg via INTRAVENOUS

## 2016-07-04 MED ORDER — MORPHINE SULFATE (PF) 4 MG/ML IV SOLN
1.0000 mg | INTRAVENOUS | Status: DC | PRN
  Administered 2016-07-04 (×2): 4 mg via INTRAVENOUS
  Filled 2016-07-04 (×2): qty 1

## 2016-07-04 MED ORDER — MEPERIDINE HCL 25 MG/ML IJ SOLN: 6.2500 mg | INTRAMUSCULAR | Status: DC | PRN

## 2016-07-04 MED ORDER — THROMBIN 5000 UNITS EX SOLR
CUTANEOUS | Status: DC | PRN
  Administered 2016-07-04 (×2): 5000 [IU] via TOPICAL

## 2016-07-04 MED ORDER — ONDANSETRON HCL 4 MG/2ML IJ SOLN: 4.0000 mg | Freq: Once | INTRAMUSCULAR | Status: DC | PRN

## 2016-07-04 MED ORDER — ALUM & MAG HYDROXIDE-SIMETH 200-200-20 MG/5ML PO SUSP: 30.0000 mL | Freq: Four times a day (QID) | ORAL | Status: DC | PRN

## 2016-07-04 MED ORDER — ROCURONIUM BROMIDE 10 MG/ML (PF) SYRINGE
PREFILLED_SYRINGE | INTRAVENOUS | Status: AC
  Filled 2016-07-04: qty 10

## 2016-07-04 MED ORDER — LIDOCAINE-EPINEPHRINE 1 %-1:100000 IJ SOLN
INTRAMUSCULAR | Status: DC | PRN
  Administered 2016-07-04: 10 mL

## 2016-07-04 MED ORDER — SUGAMMADEX SODIUM 500 MG/5ML IV SOLN
INTRAVENOUS | Status: AC
  Filled 2016-07-04: qty 5

## 2016-07-04 MED ORDER — PHENOL 1.4 % MT LIQD
1.0000 | OROMUCOSAL | Status: DC | PRN
Start: 2016-07-04 — End: 2016-07-05

## 2016-07-04 MED ORDER — ONDANSETRON HCL 4 MG/2ML IJ SOLN
INTRAMUSCULAR | Status: AC
  Filled 2016-07-04: qty 2

## 2016-07-04 MED ORDER — PANTOPRAZOLE SODIUM 40 MG PO TBEC
40.0000 mg | DELAYED_RELEASE_TABLET | Freq: Every day | ORAL | Status: DC
  Administered 2016-07-04: 40 mg via ORAL
  Filled 2016-07-04: qty 1

## 2016-07-04 MED ORDER — PROPOFOL 10 MG/ML IV BOLUS
INTRAVENOUS | Status: AC
  Filled 2016-07-04: qty 20

## 2016-07-04 MED ORDER — PROPOFOL 10 MG/ML IV BOLUS
INTRAVENOUS | Status: DC | PRN
  Administered 2016-07-04 (×2): 30 mg via INTRAVENOUS
  Administered 2016-07-04: 140 mg via INTRAVENOUS

## 2016-07-04 MED ORDER — BACITRACIN ZINC 500 UNIT/GM EX OINT
TOPICAL_OINTMENT | CUTANEOUS | Status: DC | PRN
  Administered 2016-07-04: 1 via TOPICAL

## 2016-07-04 MED ORDER — PHENYLEPHRINE HCL 10 MG/ML IJ SOLN
INTRAVENOUS | Status: DC | PRN
  Administered 2016-07-04: 20 ug/min via INTRAVENOUS

## 2016-07-04 MED ORDER — ACETAMINOPHEN 650 MG RE SUPP: 650.0000 mg | RECTAL | Status: DC | PRN

## 2016-07-04 MED ORDER — DIAZEPAM 5 MG PO TABS
5.0000 mg | ORAL_TABLET | Freq: Four times a day (QID) | ORAL | Status: DC | PRN
  Administered 2016-07-04 – 2016-07-05 (×3): 5 mg via ORAL
  Filled 2016-07-04 (×2): qty 1

## 2016-07-04 MED ORDER — DIAZEPAM 5 MG PO TABS
ORAL_TABLET | ORAL | Status: AC
  Filled 2016-07-04: qty 1

## 2016-07-04 MED ORDER — 0.9 % SODIUM CHLORIDE (POUR BTL) OPTIME
TOPICAL | Status: DC | PRN
  Administered 2016-07-04: 1000 mL

## 2016-07-04 MED ORDER — DEXAMETHASONE SODIUM PHOSPHATE 10 MG/ML IJ SOLN
INTRAMUSCULAR | Status: AC
  Filled 2016-07-04: qty 1

## 2016-07-04 MED ORDER — ACETAMINOPHEN 325 MG PO TABS: 650.0000 mg | ORAL_TABLET | ORAL | Status: DC | PRN

## 2016-07-04 MED ORDER — BISACODYL 10 MG RE SUPP: 10.0000 mg | Freq: Every day | RECTAL | Status: DC | PRN

## 2016-07-04 MED ORDER — HYDROMORPHONE HCL 1 MG/ML IJ SOLN
0.2500 mg | INTRAMUSCULAR | Status: DC | PRN
  Administered 2016-07-04 (×3): 0.5 mg via INTRAVENOUS

## 2016-07-04 MED ORDER — CYCLOBENZAPRINE HCL 10 MG PO TABS
10.0000 mg | ORAL_TABLET | Freq: Three times a day (TID) | ORAL | Status: DC | PRN
  Administered 2016-07-04: 10 mg via ORAL
  Filled 2016-07-04: qty 1

## 2016-07-04 MED ORDER — THROMBIN 5000 UNITS EX SOLR
OROMUCOSAL | Status: DC | PRN
  Administered 2016-07-04: 09:00:00 via TOPICAL

## 2016-07-04 MED ORDER — HYDROCHLOROTHIAZIDE 25 MG PO TABS: 25.0000 mg | ORAL_TABLET | Freq: Every day | ORAL | Status: DC

## 2016-07-04 MED ORDER — HEMOSTATIC AGENTS (NO CHARGE) OPTIME
TOPICAL | Status: DC | PRN
  Administered 2016-07-04: 1 via TOPICAL

## 2016-07-04 MED ORDER — GABAPENTIN 600 MG PO TABS
600.0000 mg | ORAL_TABLET | Freq: Three times a day (TID) | ORAL | Status: DC
  Administered 2016-07-04 (×2): 600 mg via ORAL
  Filled 2016-07-04 (×2): qty 1

## 2016-07-04 MED ORDER — METOPROLOL SUCCINATE ER 25 MG PO TB24: 50.0000 mg | ORAL_TABLET | Freq: Every day | ORAL | Status: DC

## 2016-07-04 MED ORDER — LIDOCAINE 2% (20 MG/ML) 5 ML SYRINGE
INTRAMUSCULAR | Status: AC
  Filled 2016-07-04: qty 5

## 2016-07-04 MED ORDER — MENTHOL 3 MG MT LOZG
1.0000 | LOZENGE | OROMUCOSAL | Status: DC | PRN
Start: 2016-07-04 — End: 2016-07-05
  Filled 2016-07-04: qty 9

## 2016-07-04 MED ORDER — ONDANSETRON HCL 4 MG/2ML IJ SOLN: 4.0000 mg | INTRAMUSCULAR | Status: DC | PRN

## 2016-07-04 MED ORDER — BACITRACIN ZINC 500 UNIT/GM EX OINT
TOPICAL_OINTMENT | CUTANEOUS | Status: AC
  Filled 2016-07-04: qty 28.35

## 2016-07-04 MED ORDER — FENTANYL CITRATE (PF) 100 MCG/2ML IJ SOLN
INTRAMUSCULAR | Status: AC
  Filled 2016-07-04: qty 4

## 2016-07-04 MED ORDER — LIDOCAINE-EPINEPHRINE 1 %-1:100000 IJ SOLN
INTRAMUSCULAR | Status: AC
Start: 2016-07-04 — End: 2016-07-04
  Filled 2016-07-04: qty 1

## 2016-07-04 MED ORDER — DEXAMETHASONE 4 MG PO TABS
4.0000 mg | ORAL_TABLET | Freq: Four times a day (QID) | ORAL | Status: AC
  Administered 2016-07-04 (×3): 4 mg via ORAL
  Filled 2016-07-04 (×3): qty 1

## 2016-07-04 MED ORDER — HYDROMORPHONE HCL 2 MG/ML IJ SOLN
INTRAMUSCULAR | Status: AC
  Filled 2016-07-04: qty 1

## 2016-07-04 MED ORDER — MIDAZOLAM HCL 5 MG/5ML IJ SOLN
INTRAMUSCULAR | Status: DC | PRN
  Administered 2016-07-04: 2 mg via INTRAVENOUS

## 2016-07-04 MED ORDER — SUGAMMADEX SODIUM 200 MG/2ML IV SOLN
INTRAVENOUS | Status: DC | PRN
  Administered 2016-07-04: 250 mg via INTRAVENOUS

## 2016-07-04 MED ORDER — MORPHINE SULFATE (PF) 2 MG/ML IV SOLN: 1.0000 mg | INTRAVENOUS | Status: DC | PRN

## 2016-07-04 MED ORDER — OXYCODONE-ACETAMINOPHEN 5-325 MG PO TABS
1.0000 | ORAL_TABLET | ORAL | Status: DC | PRN
Start: 2016-07-04 — End: 2016-07-05
  Administered 2016-07-04 – 2016-07-05 (×5): 2 via ORAL
  Filled 2016-07-04 (×4): qty 2

## 2016-07-04 MED ORDER — FENTANYL CITRATE (PF) 100 MCG/2ML IJ SOLN
INTRAMUSCULAR | Status: DC | PRN
  Administered 2016-07-04 (×2): 50 ug via INTRAVENOUS
  Administered 2016-07-04: 100 ug via INTRAVENOUS
  Administered 2016-07-04 (×2): 50 ug via INTRAVENOUS
  Administered 2016-07-04: 100 ug via INTRAVENOUS

## 2016-07-04 MED ORDER — LIDOCAINE HCL (CARDIAC) 20 MG/ML IV SOLN
INTRAVENOUS | Status: DC | PRN
  Administered 2016-07-04: 100 mg via INTRAVENOUS

## 2016-07-04 MED ORDER — HYDROMORPHONE HCL 1 MG/ML IJ SOLN
0.5000 mg | INTRAMUSCULAR | Status: DC | PRN
  Administered 2016-07-04 – 2016-07-05 (×4): 1 mg via INTRAVENOUS
  Filled 2016-07-04 (×4): qty 1

## 2016-07-04 MED ORDER — LACTATED RINGERS IV SOLN
INTRAVENOUS | Status: DC | PRN
  Administered 2016-07-04 (×2): via INTRAVENOUS

## 2016-07-04 MED ORDER — THROMBIN 5000 UNITS EX SOLR
CUTANEOUS | Status: AC
  Filled 2016-07-04: qty 10000

## 2016-07-04 MED ORDER — CEFAZOLIN SODIUM-DEXTROSE 2-4 GM/100ML-% IV SOLN
2.0000 g | Freq: Three times a day (TID) | INTRAVENOUS | Status: AC
  Administered 2016-07-04 (×2): 2 g via INTRAVENOUS
  Filled 2016-07-04 (×2): qty 100

## 2016-07-04 MED ORDER — GEMFIBROZIL 600 MG PO TABS
600.0000 mg | ORAL_TABLET | Freq: Two times a day (BID) | ORAL | Status: DC
  Administered 2016-07-04: 600 mg via ORAL
  Filled 2016-07-04 (×2): qty 1

## 2016-07-04 SURGICAL SUPPLY — 65 items
BAG DECANTER FOR FLEXI CONT (MISCELLANEOUS) ×3 IMPLANT
BENZOIN TINCTURE PRP APPL 2/3 (GAUZE/BANDAGES/DRESSINGS) ×3 IMPLANT
BIT DRILL NEURO 2X3.1 SFT TUCH (MISCELLANEOUS) ×1 IMPLANT
BLADE SURG 15 STRL LF DISP TIS (BLADE) ×1 IMPLANT
BLADE SURG 15 STRL SS (BLADE) ×2
BLADE ULTRA TIP 2M (BLADE) ×3 IMPLANT
BUR BARREL STRAIGHT FLUTE 4.0 (BURR) ×3 IMPLANT
BUR MATCHSTICK NEURO 3.0 LAGG (BURR) ×3 IMPLANT
CANISTER SUCT 3000ML PPV (MISCELLANEOUS) ×3 IMPLANT
CARTRIDGE OIL MAESTRO DRILL (MISCELLANEOUS) ×1 IMPLANT
CLOSURE WOUND 1/2 X4 (GAUZE/BANDAGES/DRESSINGS) ×1
COVER MAYO STAND STRL (DRAPES) ×3 IMPLANT
DEVICE FUSION VISTA 14X14X9MM (Spacer) ×1 IMPLANT
DIFFUSER DRILL AIR PNEUMATIC (MISCELLANEOUS) ×3 IMPLANT
DRAPE LAPAROTOMY 100X72 PEDS (DRAPES) ×3 IMPLANT
DRAPE MICROSCOPE LEICA (MISCELLANEOUS) IMPLANT
DRAPE POUCH INSTRU U-SHP 10X18 (DRAPES) ×3 IMPLANT
DRAPE SURG 17X23 STRL (DRAPES) ×6 IMPLANT
DRILL NEURO 2X3.1 SOFT TOUCH (MISCELLANEOUS) ×3
ELECT REM PT RETURN 9FT ADLT (ELECTROSURGICAL) ×3
ELECTRODE REM PT RTRN 9FT ADLT (ELECTROSURGICAL) ×1 IMPLANT
GAUZE SPONGE 4X4 12PLY STRL (GAUZE/BANDAGES/DRESSINGS) ×3 IMPLANT
GAUZE SPONGE 4X4 16PLY XRAY LF (GAUZE/BANDAGES/DRESSINGS) IMPLANT
GLOVE BIO SURGEON STRL SZ7 (GLOVE) ×6 IMPLANT
GLOVE BIO SURGEON STRL SZ8 (GLOVE) ×3 IMPLANT
GLOVE BIO SURGEON STRL SZ8.5 (GLOVE) ×3 IMPLANT
GLOVE BIOGEL PI IND STRL 7.5 (GLOVE) ×3 IMPLANT
GLOVE BIOGEL PI INDICATOR 7.5 (GLOVE) ×6
GLOVE ECLIPSE 7.0 STRL STRAW (GLOVE) ×3 IMPLANT
GLOVE EXAM NITRILE LRG STRL (GLOVE) IMPLANT
GLOVE EXAM NITRILE XL STR (GLOVE) IMPLANT
GLOVE EXAM NITRILE XS STR PU (GLOVE) IMPLANT
GOWN STRL REUS W/ TWL LRG LVL3 (GOWN DISPOSABLE) ×2 IMPLANT
GOWN STRL REUS W/ TWL XL LVL3 (GOWN DISPOSABLE) ×1 IMPLANT
GOWN STRL REUS W/TWL LRG LVL3 (GOWN DISPOSABLE) ×4
GOWN STRL REUS W/TWL XL LVL3 (GOWN DISPOSABLE) ×2
HEMOSTAT POWDER KIT SURGIFOAM (HEMOSTASIS) ×3 IMPLANT
KIT BASIN OR (CUSTOM PROCEDURE TRAY) ×3 IMPLANT
KIT ROOM TURNOVER OR (KITS) ×3 IMPLANT
MARKER SKIN DUAL TIP RULER LAB (MISCELLANEOUS) ×3 IMPLANT
NEEDLE HYPO 22GX1.5 SAFETY (NEEDLE) ×3 IMPLANT
NEEDLE SPNL 18GX3.5 QUINCKE PK (NEEDLE) ×3 IMPLANT
NS IRRIG 1000ML POUR BTL (IV SOLUTION) ×3 IMPLANT
OIL CARTRIDGE MAESTRO DRILL (MISCELLANEOUS) ×3
PACK LAMINECTOMY NEURO (CUSTOM PROCEDURE TRAY) ×3 IMPLANT
PATTIES SURGICAL .5 X.5 (GAUZE/BANDAGES/DRESSINGS) ×3 IMPLANT
PATTIES SURGICAL 1X1 (DISPOSABLE) ×3 IMPLANT
PEEK VISTA 14X14X8MM (Peek) ×3 IMPLANT
PIN DISTRACTION 14MM (PIN) ×6 IMPLANT
PLATE ANT CERV XTEND ELD 1 L14 (Plate) ×3 IMPLANT
PUTTY KINEX BIOACTIVE 2CC (Bone Implant) ×6 IMPLANT
RUBBERBAND STERILE (MISCELLANEOUS) IMPLANT
SCREW XTD VAR 4.2 SELF TAP (Screw) ×9 IMPLANT
SCREW XTD VAR 4.2 SELF TAP 12 (Screw) ×3 IMPLANT
SPONGE INTESTINAL PEANUT (DISPOSABLE) ×6 IMPLANT
SPONGE SURGIFOAM ABS GEL SZ50 (HEMOSTASIS) ×3 IMPLANT
STRIP CLOSURE SKIN 1/2X4 (GAUZE/BANDAGES/DRESSINGS) ×2 IMPLANT
SUT VIC AB 0 CT1 27 (SUTURE) ×2
SUT VIC AB 0 CT1 27XBRD ANTBC (SUTURE) ×1 IMPLANT
SUT VIC AB 3-0 SH 8-18 (SUTURE) ×3 IMPLANT
TAPE CLOTH SURG 4X10 WHT LF (GAUZE/BANDAGES/DRESSINGS) ×3 IMPLANT
TOWEL OR 17X24 6PK STRL BLUE (TOWEL DISPOSABLE) ×3 IMPLANT
TOWEL OR 17X26 10 PK STRL BLUE (TOWEL DISPOSABLE) ×3 IMPLANT
VISTA 14X14X9MM (Spacer) ×3 IMPLANT
WATER STERILE IRR 1000ML POUR (IV SOLUTION) ×3 IMPLANT

## 2016-07-04 NOTE — Anesthesia Postprocedure Evaluation (Signed)
Anesthesia Post Note  Patient: Javier Griffin  Procedure(s) Performed: Procedure(s) (LRB): ANTERIOR CERVICAL DECOMPRESSION/DISCECTOMY FUSION, INTERBODY PROSTHESIS,PLATE Cervical three-four (N/A)  Patient location during evaluation: PACU Anesthesia Type: General Level of consciousness: awake and alert Pain management: pain level controlled Vital Signs Assessment: post-procedure vital signs reviewed and stable Respiratory status: spontaneous breathing, nonlabored ventilation, respiratory function stable and patient connected to nasal cannula oxygen Cardiovascular status: blood pressure returned to baseline and stable Postop Assessment: no signs of nausea or vomiting Anesthetic complications: no    Last Vitals:  Vitals:   07/04/16 1205 07/04/16 1247  BP: 115/79 123/87  Pulse: 72 79  Resp: 11 16  Temp:      Last Pain:  Vitals:   07/04/16 1220  TempSrc:   PainSc: 7                  Adaley Kiene DAVID

## 2016-07-04 NOTE — Progress Notes (Signed)
Patient ID: Tiburcio BashMark B Howatt, male   DOB: 12/11/1970, 45 y.o.   MRN: [id number] Subjective:  The patient is alert and pleasant. He is in no apparent distress. He looks well.  Objective: Vital signs in last 24 hours: Temp:  [97 F (36.1 C)-97.8 F (36.6 C)] 97 F (36.1 C) (11/16 1037) Pulse Rate:  [73-99] 92 (11/16 1105) Resp:  [13-18] 18 (11/16 1105) BP: (114-135)/(77-102) 122/77 (11/16 1105) SpO2:  [95 %-97 %] 97 % (11/16 1105) Weight:  [110.2 kg (243 lb)] 110.2 kg (243 lb) (11/16 0636)  Intake/Output from previous day: No intake/output data recorded. Intake/Output this shift: Total I/O In: 1000 [I.V.:1000] Out: 200 [Blood:200]  Physical exam the patient is alert and pleasant. He is moving all 4 extremities well. His dressing is clean and dry. There is no evidence of hematoma or shift.  Lab Results: No results for input(s): WBC, HGB, HCT, PLT in the last 72 hours. BMET No results for input(s): NA, K, CL, CO2, GLUCOSE, BUN, CREATININE, CALCIUM in the last 72 hours.  Studies/Results: Dg Cervical Spine 2 Or 3 Views  Result Date: 07/04/2016 CLINICAL DATA:  Anterior cervical decompression EXAM: CERVICAL SPINE - 1 VIEW COMPARISON:  Cervical spine CT post myelogram November 21, 2015 FINDINGS: Cross-table lateral cervical spine image labeled #1 submitted. Metallic probe tip is anterior to the C3-4 interspace level. There is multilevel arthropathy. No fracture or spondylolisthesis evident. Patient has had previous posterior fusion at C4 and C5. IMPRESSION: Metallic probe tip anterior to the C3-4 interspace. Postoperative change noted at C4 and C5. Electronically Signed   By: Bretta BangWilliam  Woodruff III M.D.   On: 07/04/2016 09:30    Assessment/Plan: The patient is doing well. I spoke with his mother.  LOS: 0 days     Lino Wickliff D 07/04/2016, 11:35 AM

## 2016-07-04 NOTE — Anesthesia Preprocedure Evaluation (Signed)
Anesthesia Evaluation  Patient identified by MRN, date of birth, ID band Patient awake    Reviewed: Allergy & Precautions, NPO status , Patient's Chart, lab work & pertinent test results  Airway Mallampati: I  TM Distance: >3 FB Neck ROM: Full    Dental   Pulmonary    Pulmonary exam normal       Cardiovascular hypertension, Pt. on medications Normal cardiovascular exam    Neuro/Psych    GI/Hepatic GERD-  Medicated and Controlled,  Endo/Other    Renal/GU      Musculoskeletal   Abdominal   Peds  Hematology   Anesthesia Other Findings   Reproductive/Obstetrics                             Anesthesia Physical Anesthesia Plan  ASA: II  Anesthesia Plan: General   Post-op Pain Management:    Induction: Intravenous  Airway Management Planned: Oral ETT  Additional Equipment:   Intra-op Plan:   Post-operative Plan: Extubation in OR  Informed Consent: I have reviewed the patients History and Physical, chart, labs and discussed the procedure including the risks, benefits and alternatives for the proposed anesthesia with the patient or authorized representative who has indicated his/her understanding and acceptance.     Plan Discussed with: CRNA and Surgeon  Anesthesia Plan Comments:         Anesthesia Quick Evaluation  

## 2016-07-04 NOTE — Transfer of Care (Signed)
Immediate Anesthesia Transfer of Care Note  Patient: Javier Griffin  Procedure(s) Performed: Procedure(s): ANTERIOR CERVICAL DECOMPRESSION/DISCECTOMY FUSION, INTERBODY PROSTHESIS,PLATE Cervical three-four (N/A)  Patient Location: PACU  Anesthesia Type:General  Level of Consciousness: awake, alert  and oriented  Airway & Oxygen Therapy: Patient Spontanous Breathing and Patient connected to nasal cannula oxygen  Post-op Assessment: Report given to RN, Post -op Vital signs reviewed and stable and Patient moving all extremities X 4  Post vital signs: Reviewed and stable  Last Vitals:  Vitals:   07/04/16 0636 07/04/16 1037  BP: (!) 135/102   Pulse: 73   Resp: 18   Temp: 36.6 C 36.1 C    Last Pain:  Vitals:   07/04/16 0641  TempSrc:   PainSc: 5          Complications: No apparent anesthesia complications

## 2016-07-04 NOTE — H&P (Signed)
Subjective: Javier Griffin is a 45 year old white male who has had multiple prior neck surgeries. He has complained of neck and shoulder pain consistent with a cervical radiculopathy. He has failed medical management and was worked up with a cervical MRI. This demonstrates spondylosis and stenosis at C3-4. I discussed the various treatment options with the patient including surgery. He has weighed the risks, benefits, and alternatives to surgery & proceed with C3-4 anterior cervical discectomy, fusion, and plating.   Past Medical History:  Diagnosis Date  . Arthritis   . Bronchitis   . GERD (gastroesophageal reflux disease)   . Headache    due to neck pain  . Hypertension    treated at one time, no medications at this time  . Neck injury     Past Surgical History:  Procedure Laterality Date  . ANTERIOR CERVICAL DECOMP/DISCECTOMY FUSION N/A 04/22/2013   Procedure: ANTERIOR CERVICAL DECOMPRESSION/DISCECTOMY FUSION 1 CERVICAL SEVEN-THORACIC ONELEVEL/HARDWARE REMOVAL;  Surgeon: Javier LoronJeffrey D Shada Nienaber, MD;  Location: [facility];  Service: Neurosurgery;  Laterality: N/A;  C7/T1 anterior cervical decompression with fusion interbody prothesis plating and bonegraft with removal of codman slim loc plate  . carpel tunnel Left    and elbow   . CERVICAL SPINE SURGERY     x3  . ELBOW SURGERY Left    nerve release  . POSTERIOR CERVICAL FUSION/FORAMINOTOMY N/A 05/26/2014   Procedure: Cervical four-five, Cervical seven-Thoracic one Posterior Cervical Instrumentation and Fusion;  Surgeon: [facility]avier StalkerJeffrey Calliope Delangel, MD;  Location: [facility];  Service: Neurosurgery;  Laterality: N/A;  . POSTERIOR CERVICAL LAMINECTOMY N/A 02/08/2015   Procedure: Removal of Thoracic one Pedicle Screw;  Surgeon: [facility]avier StalkerJeffrey Rozalynn Buege, MD;  Location: [facility];  Service: Neurosurgery;  Laterality: N/A;  removal of T1 pedicle screw  . WISDOM TOOTH EXTRACTION      Allergies  Allergen Reactions  . Amlodipine     Leg swelling  . Lisinopril Cough     Social History  Substance Use Topics  . Smoking status: Never Smoker  . Smokeless tobacco: Former NeurosurgeonUser    Types: Snuff    Quit date: 05/24/1992  . Alcohol use 4.8 oz/week    8 Cans of beer per week    Family History  Problem Relation Age of Onset  . Lung cancer Mother   . Hypertension Father   . Emphysema Maternal Grandfather   . Heart disease Paternal Grandfather    Prior to Admission medications   Medication Sig Start Date End Date Taking? Authorizing Provider  cyclobenzaprine (FLEXERIL) 10 MG tablet TAKE 1 TABLET BY MOUTH 3 TIMES A DAY AS NEEDED FOR MUSCLE SPASM IF OVERSEDATED W/OTHER MED STOP 06/10/16  Yes Anola Gurneyobert Chauvin, PA  fluticasone (FLONASE) 50 MCG/ACT nasal spray Place 2 sprays into both nostrils daily. Patient taking differently: Place 2 sprays into both nostrils daily as needed.  03/21/16  Yes Anola Gurneyobert Chauvin, PA  gabapentin (NEURONTIN) 600 MG tablet Take 600 mg by mouth 3 (three) times daily.   Yes Historical Provider, MD  gemfibrozil (LOPID) 600 MG tablet Take 1 tablet (600 mg total) by mouth 2 (two) times daily before a meal. 06/08/16  Yes Anola Gurneyobert Chauvin, PA  hydrochlorothiazide (HYDRODIURIL) 25 MG tablet Take 1 tablet (25 mg total) by mouth daily. 10/11/15  Yes Anola Gurneyobert Chauvin, PA  lansoprazole (PREVACID) 30 MG capsule TAKE ONE CAPSULE DAILY AS NEEDED FOR ACID REFLUX 04/01/16  Yes Anola Gurneyobert Chauvin, PA  losartan (COZAAR) 100 MG tablet Take 1 tablet (100 mg total) by mouth daily. 09/08/15  Yes Anola Gurneyobert Chauvin, PA  metoprolol succinate (TOPROL-XL) 50 MG 24 hr tablet Take 1 tablet (50 mg total) by mouth daily. Take with or immediately following a meal. 01/10/16  Yes Anola Gurneyobert Chauvin, PA     Review of Systems  Positive ROS: As above  All other systems have been reviewed and were otherwise negative with the exception of those mentioned in the HPI and as above.  Objective: Vital signs in last 24 hours: Temp:  [97.8 F (36.6 C)] 97.8 F (36.6 C) (11/16 0636) Pulse Rate:  [73] 73  (11/16 0636) Resp:  [18] 18 (11/16 0636) BP: (135)/(102) 135/102 (11/16 0636) SpO2:  [96 %] 96 % (11/16 0636) Weight:  [110.2 kg (243 lb)] 110.2 kg (243 lb) (11/16 0636)  General Appearance: Alert, cooperative, no distress, Head: Normocephalic, without obvious abnormality, atraumatic Eyes: PERRL, conjunctiva/corneas clear, EOM's intact,    Ears: Normal  Throat: Normal  Neck: Supple, symmetrical, trachea midline, no adenopathy; thyroid: No enlargement/tenderness/nodules; no carotid bruit or JVDMother patient's anterior and posterior cervical incisions are well-healed. Back: Symmetric, no curvature, ROM normal, no CVA tenderness Lungs: Clear to auscultation bilaterally, respirations unlabored Heart: Regular rate and rhythm, no murmur, rub or gallop Abdomen: Soft, non-tender,, no masses, no organomegaly Extremities: Extremities normal, atraumatic, no cyanosis or edema Pulses: 2+ and symmetric all extremities Skin: Skin color, texture, turgor normal, no rashes or lesions  NEUROLOGIC:   Mental status: alert and oriented, no aphasia, good attention span, Fund of knowledge/ memory ok Motor Exam - grossly normal Sensory Exam - grossly normal Reflexes:  Coordination - grossly normal Gait - grossly normal Balance - grossly normal Cranial Nerves: I: smell Not tested  II: visual acuity  OS: Normal  OD: Normal   II: visual fields Full to confrontation  II: pupils Equal, round, reactive to light  III,VII: ptosis None  III,IV,VI: extraocular muscles  Full ROM  V: mastication Normal  V: facial light touch sensation  Normal  V,VII: corneal reflex  Present  VII: facial muscle function - upper  Normal  VII: facial muscle function - lower Normal  VIII: hearing Not tested  IX: soft palate elevation  Normal  IX,X: gag reflex Present  XI: trapezius strength  5/5  XI: sternocleidomastoid strength 5/5  XI: neck flexion strength  5/5  XII: tongue strength  Normal    Data Review Lab Results   Component Value Date   WBC 4.8 06/26/2016   HGB 13.9 06/26/2016   HCT 39.5 06/26/2016   MCV 87.6 06/26/2016   PLT 186 06/26/2016   Lab Results  Component Value Date   NA 138 06/26/2016   K 4.2 06/26/2016   CL 104 06/26/2016   CO2 21 (L) 06/26/2016   BUN 16 06/26/2016   CREATININE 0.90 06/26/2016   GLUCOSE 91 06/26/2016   Lab Results  Component Value Date   INR 0.83 06/26/2016    Assessment/Plan: 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 We have discussed the various treatment options including surgery. I have described the surgical treatment option of a C3-4 anterior cervical discectomy, fusion, and plating. I have shown him surgical models. We have discussed the risks, benefits, alternatives, expected postoperative course, and likelihood of achieving her goals with surgery. I have answered all the patient's questions. He has decided to proceed with surgery.   Daun Rens D 07/04/2016 7:20 AM

## 2016-07-04 NOTE — Op Note (Signed)
Brief history: The patient is a 45 year old white male who's had multiple prior neck surgeries. He has developed recurrent neck pain with pain into his left shoulder. He has failed medical management and was worked up with a cervical MRI which demonstrated disc degeneration and spondylosis with foraminal stenosis at C3-4. I discussed the various treatment options with the patient including surgery. He has weighed the risks, benefits, and alternatives to surgery and decided to proceed with a C3-4 anterior cervical discectomy, fusion, and plating.  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 Delma OfficerJeff Mailin Coglianese  Asst.: Dr. Conchita ParisNundkumar  Anesthesia: Gen. endotracheal  Estimated blood loss: Minimal  Drains: None  Complications: None  Description of procedure: The patient was brought to the operating room by the anesthesia team. General endotracheal anesthesia was induced. A roll was placed under the patient's shoulders to keep the neck in the neutral position. [name] anterior cervical region was then prepared with Betadine scrub and Betadine solution. Sterile drapes were applied.  The area to be incised was then injected with Marcaine with epinephrine solution. I then used a scalpel to make a transverse incision in the patient's left anterior neck. I used the Metzenbaum scissors to divide the platysmal muscle and then to dissect medial to the sternocleidomastoid muscle, jugular vein, and carotid artery. I carefully dissected down towards the anterior cervical spine identifying the esophagus and  retracting it medially. Then using Kitner swabs to clear soft tissue from the anterior cervical spine. We then inserted a bent spinal needle into the upper exposed intervertebral disc space. We then obtained intraoperative radiographs confirm our location.  I then used electrocautery to detach the medial border of the longus colli muscle bilaterally from the C3-4 intervertebral disc spaces. I then inserted the Caspar self-retaining retractor underneath the longus colli muscle bilaterally to provide exposure.  We then incised the intervertebral disc at C3-4. We then performed a partial intervertebral discectomy with a pituitary forceps and the Karlin curettes. I then inserted distraction screws into the vertebral bodies at C3-4. We then distracted the interspace. We then used the high-speed drill to decorticate the vertebral endplates at C3-4, to drill away the remainder of the intervertebral disc, to drill away some posterior spondylosis, and to thin out the posterior longitudinal ligament. I then incised ligament with the arachnoid knife. We then removed the ligament with a Kerrison punches undercutting the vertebral endplates and decompressing the thecal sac. We then performed foraminotomies about the bilateral C4 nerve roots. This completed the decompression at this level.  We now turned our to attention to the interbody fusion. We used the trial spacers to determine the appropriate size for the interbody prosthesis. We then pre-filled prosthesis with a combination of local morcellized autograft bone that we obtained during decompression as well as Kinnex bone graft extender. We then inserted the prosthesis into the distracted interspace at C3-4. We then removed the distraction screws. There was a good snug fit of the prosthesis in the interspace.  Having completed the fusion we now turned attention to the anterior spinal instrumentation. We used the high-speed drill to drill away some anterior  spondylosis at the disc spaces so that the plate lay down flat. We selected the appropriate  length titanium anterior cervical plate. We laid it along the anterior aspect of the vertebral bodies from C3-4. We then drilled 14 mm holes at C3 and C4. We then secured the plate to the vertebral bodies by placing 12 and 14 mm mm self-tapping screws at C3 and C4. We then obtained intraoperative radiograph. The demonstrating good position of the instrumentation. We therefore secured the screws the plate the locking each cam. This completed the instrumentation.  We then obtained hemostasis using bipolar electrocautery. We irrigated the wound out with bacitracin solution. We then removed the retractor. We inspected the esophagus for any damage. There was none apparent. We then reapproximated patient's platysmal muscle with interrupted 3-0 Vicryl suture. We then reapproximated the subcutaneous tissue with interrupted 3-0 Vicryl suture. The skin was reapproximated with Steri-Strips and benzoin. The wound was then covered with bacitracin ointment. A sterile dressing was applied. The drapes were removed. Patient was subsequently extubated by the anesthesia team and transported to the post anesthesia care unit in stable condition. All sponge instrument and needle counts were reportedly correct at the end of this case.

## 2016-07-04 NOTE — Progress Notes (Signed)
Patient ID: Tiburcio BashMark B Dedman, male   DOB: 05/11/1971, 45 y.o.   MRN: [id number] Subjective:  The patient is alert and pleasant. He looks well. He is in no apparent distress. His mother is at the bedside.  Objective: Vital signs in last 24 hours: Temp:  [97 F (36.1 C)-97.8 F (36.6 C)] 97 F (36.1 C) (11/16 1037) Pulse Rate:  [72-99] 79 (11/16 1247) Resp:  [11-18] 16 (11/16 1247) BP: (114-135)/(77-102) 123/87 (11/16 1247) SpO2:  [93 %-97 %] 95 % (11/16 1247) Weight:  [110.2 kg (243 lb)] 110.2 kg (243 lb) (11/16 0636)  Intake/Output from previous day: No intake/output data recorded. Intake/Output this shift: Total I/O In: 1340 [P.O.:240; I.V.:1000; IV Piggyback:100] Out: 200 [Blood:200]  Physical exam the patient is alert and pleasant. He is moving all 4 extremities well. His dressing is clean and dry. There is no evidence of hematoma or shift.  Lab Results: No results for input(s): WBC, HGB, HCT, PLT in the last 72 hours. BMET No results for input(s): NA, K, CL, CO2, GLUCOSE, BUN, CREATININE, CALCIUM in the last 72 hours.  Studies/Results: Dg Cervical Spine 2 Or 3 Views  Addendum Date: 07/04/2016   ADDENDUM REPORT: 07/04/2016 11:46 ADDENDUM: There is a second cross-table lateral cervical image deck cane through after the first image time stamped 9:59:24 and labeled #2. On this image, there is anterior screw and plate fixation at C3 and C4 with a disc spacer in this area. Postoperative change posteriorly at C4 and C5 remains. No spondylolisthesis or fracture. Note that this study is a two-view cervical spine series, not single view. Electronically Signed   By: Bretta BangWilliam  Woodruff III M.D.   On: 07/04/2016 11:46   Result Date: 07/04/2016 CLINICAL DATA:  Anterior cervical decompression EXAM: CERVICAL SPINE - 1 VIEW COMPARISON:  Cervical spine CT post myelogram November 21, 2015 FINDINGS: Cross-table lateral cervical spine image labeled #1 submitted. Metallic probe tip is anterior to the C3-4  interspace level. There is multilevel arthropathy. No fracture or spondylolisthesis evident. Patient has had previous posterior fusion at C4 and C5. IMPRESSION: Metallic probe tip anterior to the C3-4 interspace. Postoperative change noted at C4 and C5. Electronically Signed: By: Bretta BangWilliam  Woodruff III M.D. On: 07/04/2016 09:30    Assessment/Plan: The patient is doing well. He will likely go home tomorrow.  LOS: 0 days     Dajha Urquilla D 07/04/2016, 4:13 PM

## 2016-07-04 NOTE — Anesthesia Procedure Notes (Signed)
Procedure Name: Intubation Date/Time: 07/04/2016 7:36 AM Performed by: Lanell MatarBAKER, Arieonna Medine M Pre-anesthesia Checklist: Patient identified, Emergency Drugs available, Suction available and Patient being monitored Patient Re-evaluated:Patient Re-evaluated prior to inductionOxygen Delivery Method: Circle System Utilized Preoxygenation: Pre-oxygenation with 100% oxygen Intubation Type: IV induction Ventilation: Mask ventilation without difficulty and Oral airway inserted - appropriate to patient size Laryngoscope Size: Hyacinth MeekerMiller and 2 Grade View: Grade I Tube type: Oral Tube size: 7.5 mm Number of attempts: 1 Airway Equipment and Method: Stylet and Oral airway Placement Confirmation: ETT inserted through vocal cords under direct vision,  positive ETCO2 and breath sounds checked- equal and bilateral Secured at: 22 cm Tube secured with: Tape Dental Injury: Teeth and Oropharynx as per pre-operative assessment

## 2016-07-05 ENCOUNTER — Encounter (HOSPITAL_COMMUNITY): Payer: Self-pay | Admitting: Neurosurgery

## 2016-07-05 DIAGNOSIS — M5011 Cervical disc disorder with radiculopathy,  high cervical region: Secondary | ICD-10-CM | POA: Diagnosis not present

## 2016-07-05 MED ORDER — OXYCODONE-ACETAMINOPHEN 10-325 MG PO TABS: 1.0000 | ORAL_TABLET | ORAL | 0 refills | Status: DC | PRN

## 2016-07-05 MED ORDER — CYCLOBENZAPRINE HCL 10 MG PO TABS: 10.0000 mg | ORAL_TABLET | Freq: Three times a day (TID) | ORAL | 1 refills | Status: DC | PRN

## 2016-07-05 MED ORDER — INFLUENZA VAC SPLIT QUAD 0.5 ML IM SUSY: 0.5000 mL | PREFILLED_SYRINGE | Freq: Once | INTRAMUSCULAR | Status: DC

## 2016-07-05 MED ORDER — DOCUSATE SODIUM 100 MG PO CAPS: 100.0000 mg | ORAL_CAPSULE | Freq: Two times a day (BID) | ORAL | 0 refills | Status: DC

## 2016-07-05 NOTE — Progress Notes (Signed)
Pt doing well. Pt given D/C instructions with Rx's,verbal understanding was provided. Pt's incision is clean and dry with no sign of infection. Pt's IV was removed prior to D/C. Pt D/C'd home via walking @ 0825 per MD order. Pt is stable @ D/C and has no other needs at this time. Rema FendtAshley Crystie Yanko, RN

## 2016-07-05 NOTE — Discharge Instructions (Signed)

## 2016-07-05 NOTE — Discharge Summary (Signed)
  Physician Discharge Summary  Patient ID: Javier Griffin MRN: [id number] DOB/AGE: 43/03/1971 43 y.o.  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 The surgery went well.  [name] postoperative course was unremarkable. On postoperative day #1 he requested discharge to home. He was given written and oral discharge instructions. All his questions were answered. He was informed of the risk of overdose if he does not take his medications as directed. He was also informed his prescription would not be filled early and that he would be weaned off his medications over the near future.   Consults: None Significant Diagnostic Studies: None Treatments: C3-4 anterior cervical discectomy, fusion, and plating. Discharge Exam: Blood pressure 127/79, pulse 87, temperature 97.7 F (36.5 C), temperature source Oral, resp. rate 18, weight 110.2 kg (243 lb), SpO2 94 %. The patient is alert and pleasant. He is moving all 4 extremities well. His dressing is clean and dry. There is no evidence of hematoma or shift.  Disposition: Home     Medication List    TAKE these medications   cyclobenzaprine 10 MG tablet Commonly known as:  FLEXERIL TAKE 1 TABLET BY MOUTH 3 TIMES A DAY AS NEEDED FOR MUSCLE SPASM IF OVERSEDATED W/OTHER MED STOP What changed:  Another medication with the same name was added. Make sure you understand how and when to take each.   cyclobenzaprine 10 MG tablet Commonly known as:  FLEXERIL Take 1 tablet (10 mg total) by mouth 3 (three) times daily as needed for muscle spasms. What changed:  You were already taking a medication with the  same name, and this prescription was added. Make sure you understand how and when to take each.   docusate sodium 100 MG capsule Commonly known as:  COLACE Take 1 capsule (100 mg total) by mouth 2 (two) times daily.   fluticasone 50 MCG/ACT nasal spray Commonly known as:  FLONASE Place 2 sprays into both nostrils daily. What changed:  when to take this  reasons to take this   gabapentin 600 MG tablet Commonly known as:  NEURONTIN Take 600 mg by mouth 3 (three) times daily.   gemfibrozil 600 MG tablet Commonly known as:  LOPID Take 1 tablet (600 mg total) by mouth 2 (two) times daily before a meal.   hydrochlorothiazide 25 MG tablet Commonly known as:  HYDRODIURIL Take 1 tablet (25 mg total) by mouth daily.   lansoprazole 30 MG capsule Commonly known as:  PREVACID TAKE ONE CAPSULE DAILY AS NEEDED FOR ACID REFLUX   losartan 100 MG tablet Commonly known as:  COZAAR Take 1 tablet (100 mg total) by mouth daily.   metoprolol succinate 50 MG 24 hr tablet Commonly known as:  TOPROL-XL Take 1 tablet (50 mg total) by mouth daily. Take with or immediately following a meal.   oxyCODONE-acetaminophen 10-325 MG tablet Commonly known as:  PERCOCET Take 1 tablet by mouth every 4 (four) hours as needed for pain.        SignedCristi Loron: [name] D 07/05/2016, 7:43 AM

## 2016-07-11 ENCOUNTER — Other Ambulatory Visit: Payer: Self-pay | Admitting: Family Medicine

## 2016-07-11 DIAGNOSIS — I10 Essential (primary) hypertension: Secondary | ICD-10-CM

## 2016-08-07 ENCOUNTER — Other Ambulatory Visit: Payer: Self-pay | Admitting: Family Medicine

## 2016-08-07 DIAGNOSIS — I10 Essential (primary) hypertension: Secondary | ICD-10-CM

## 2016-09-05 ENCOUNTER — Other Ambulatory Visit: Payer: Self-pay | Admitting: Family Medicine

## 2016-09-05 DIAGNOSIS — E781 Pure hyperglyceridemia: Secondary | ICD-10-CM

## 2016-09-21 ENCOUNTER — Other Ambulatory Visit: Payer: Self-pay | Admitting: Family Medicine

## 2016-09-21 DIAGNOSIS — I10 Essential (primary) hypertension: Secondary | ICD-10-CM

## 2016-10-16 ENCOUNTER — Other Ambulatory Visit: Payer: Self-pay | Admitting: Family Medicine

## 2016-10-16 DIAGNOSIS — J301 Allergic rhinitis due to pollen: Secondary | ICD-10-CM

## 2016-12-02 ENCOUNTER — Other Ambulatory Visit: Payer: Self-pay | Admitting: Family Medicine

## 2016-12-02 DIAGNOSIS — E781 Pure hyperglyceridemia: Secondary | ICD-10-CM

## 2017-01-07 ENCOUNTER — Other Ambulatory Visit: Payer: Self-pay | Admitting: Family Medicine

## 2017-01-07 DIAGNOSIS — I10 Essential (primary) hypertension: Secondary | ICD-10-CM

## 2017-02-13 ENCOUNTER — Other Ambulatory Visit: Payer: Self-pay | Admitting: Family Medicine

## 2017-02-13 DIAGNOSIS — I10 Essential (primary) hypertension: Secondary | ICD-10-CM

## 2017-03-06 ENCOUNTER — Other Ambulatory Visit: Payer: Self-pay | Admitting: Family Medicine

## 2017-03-06 DIAGNOSIS — E781 Pure hyperglyceridemia: Secondary | ICD-10-CM

## 2017-03-25 IMAGING — CT CT ABD-PELV W/ CM
2 of 5 series · 15 of 46 positions shown, 17 images · IV contrast (iopamidol)
Comparison: CT abdomen pelvis -12/16/2015

CLINICAL DATA: Recent diagnosis of pancreatitis with persistent
abdominal pain.

EXAM:
CT ABDOMEN AND PELVIS WITH CONTRAST
TECHNIQUE: Multidetector CT imaging of the abdomen and pelvis was performed
using the standard protocol following bolus administration of
intravenous contrast.
CONTRAST:  100mL [id number] IOPAMIDOL (TS0N3M-FVV) INJECTION 61%

[Series 2: routine abd pel with · axial · 0.81mm/px · z∈[-1233,-778]mm · 12 of 103 slices shown, 14 images]
[im 6/103  soft-tissue]
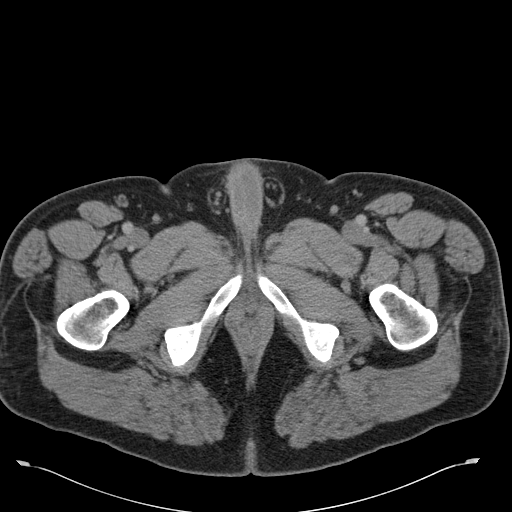
[im 6/103  bone]
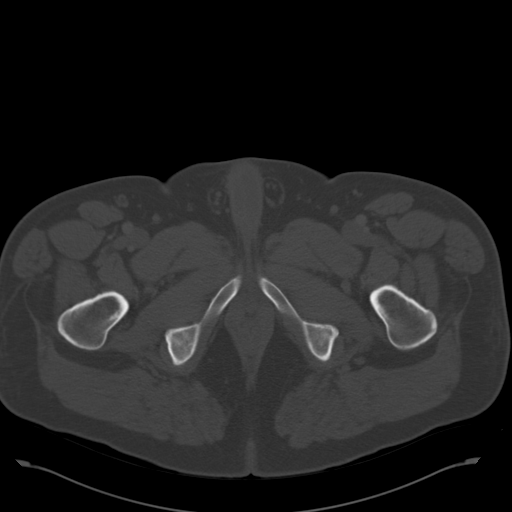
[im 18/103  soft-tissue]
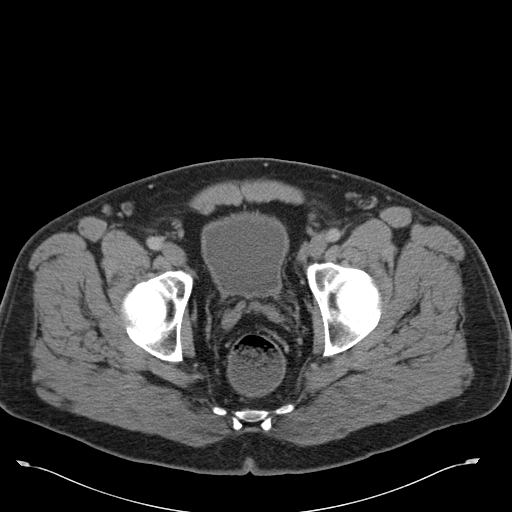
[im 23/103  soft-tissue]
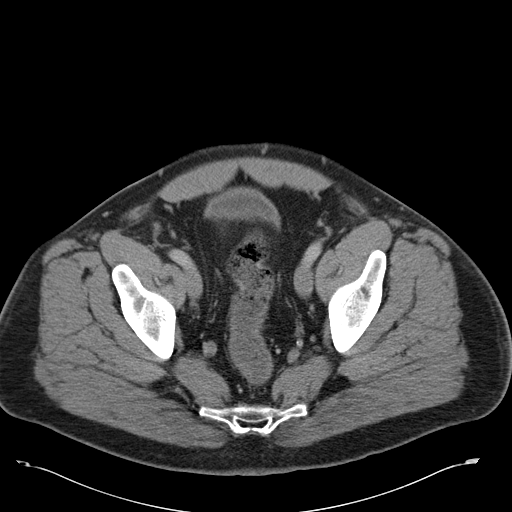
[im 29/103  soft-tissue]
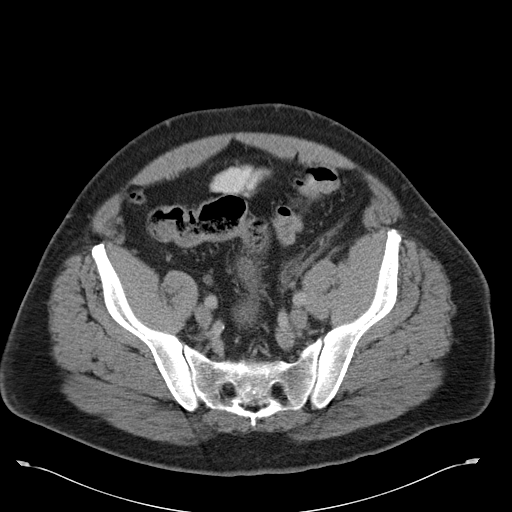
[im 40/103  soft-tissue]
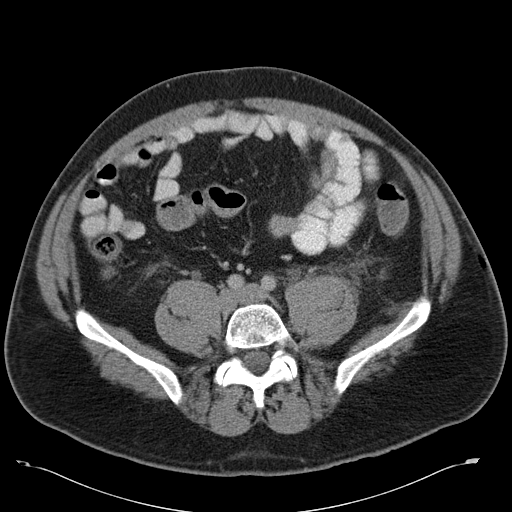
[im 46/103  soft-tissue]
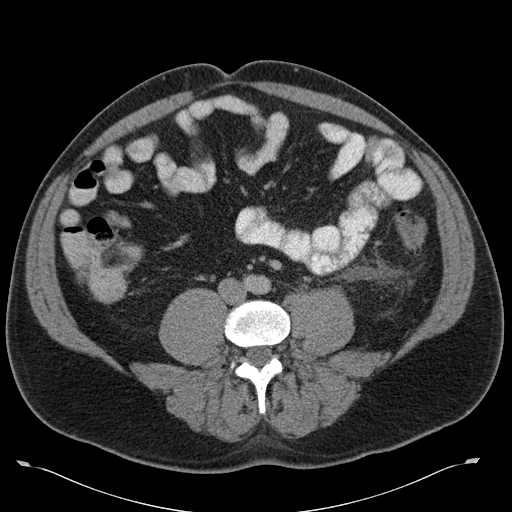
[im 57/103  soft-tissue]
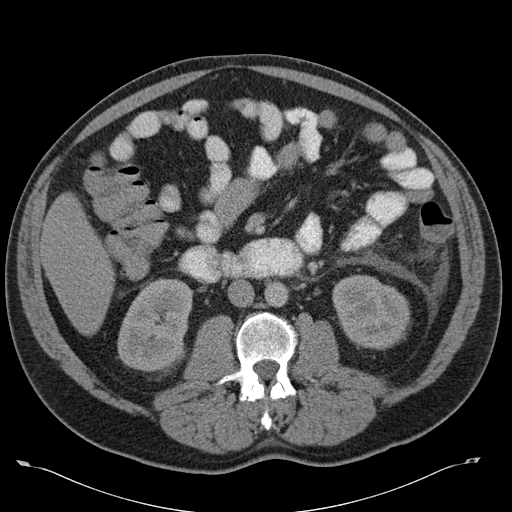
[im 63/103  soft-tissue]
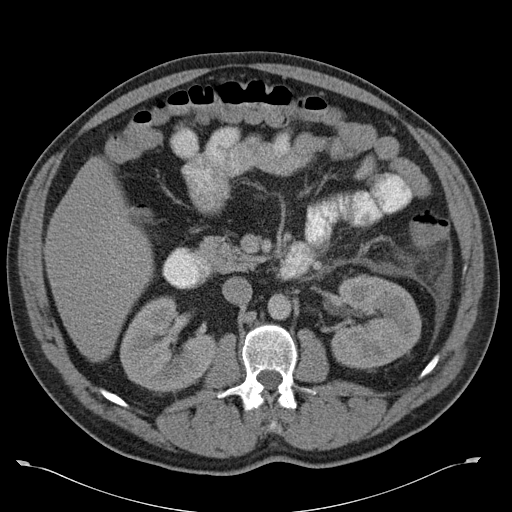
[im 74/103  soft-tissue]
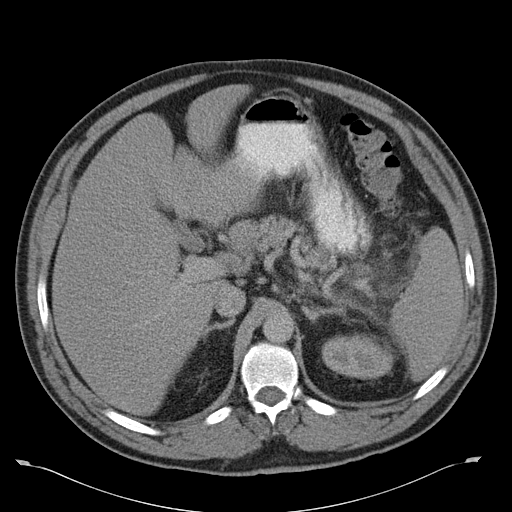
[im 74/103  bone]
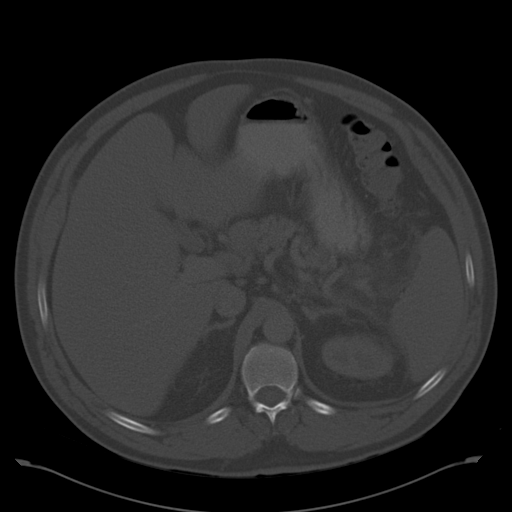
[im 80/103  soft-tissue]
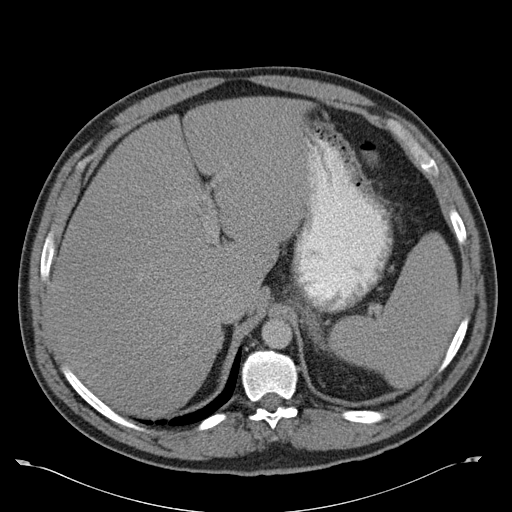
[im 86/103  soft-tissue]
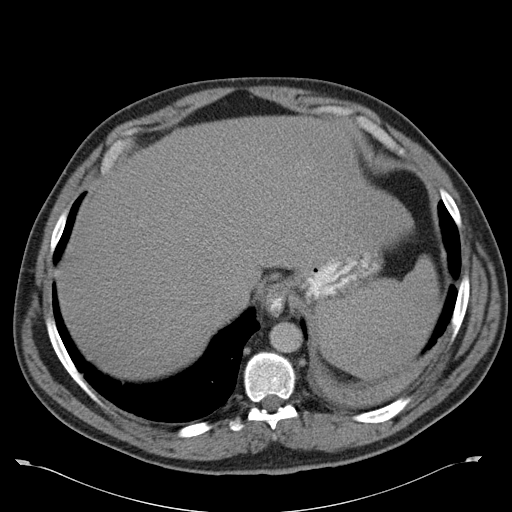
[im 97/103  soft-tissue]
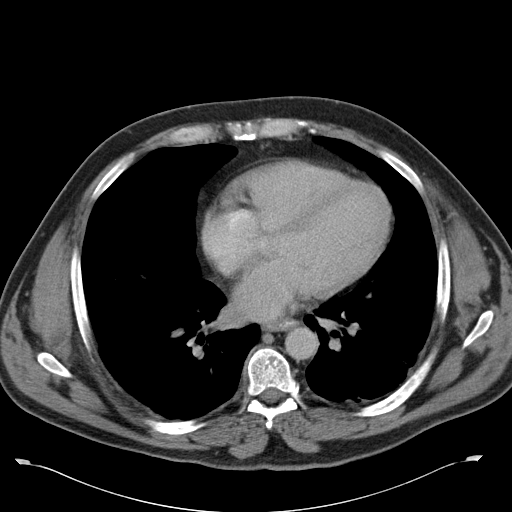

[Series 7: cor routine abd pel with · coronal · 0.86mm/px · 3 of 178 slices shown]
[im 60/178  soft-tissue]
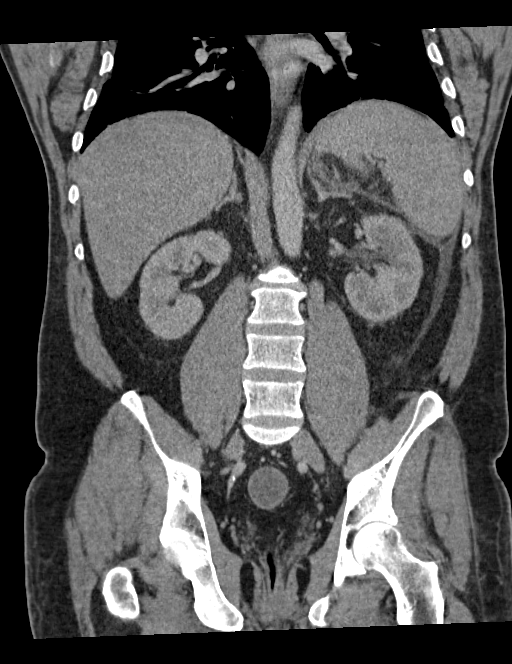
[im 79/178  soft-tissue]
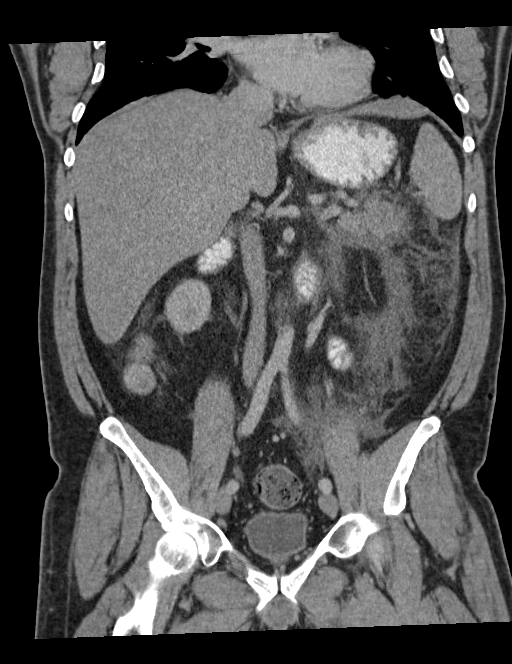
[im 99/178  soft-tissue]
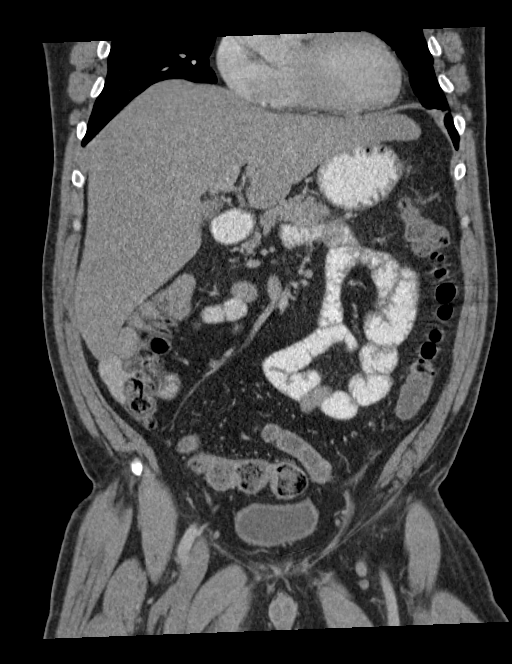

[15 of 46 positions shown; findings below may reference images not displayed]

FINDINGS: Lower chest: Limited visualization of lower thorax demonstrates
bibasilar subsegmental atelectasis, left greater than right,
progressed since the [DATE] [DATE] examination. No pleural
effusion.

Normal heart size.  No pericardial effusion.

Hepatobiliary: Normal hepatic contour. No discrete hepatic lesions.
Normal appearance of the gallbladder given degree distention. No
radiopaque gallstones. No ascites.

Pancreas: Re-demonstrated peripancreatic stranding with fluid
tracking along the anterior aspect of the left para renal space
(representative image 42, series 2), minimally progressed compared
to the 12/16/2015 examination though without peripheral enhancement
to suggest development of a definable/drainable fluid collection.
There is preserved enhancement of the pancreatic parenchyma without
definitive evidence of pancreatic necrosis. Evaluation for
definitive pancreatic ductal dilatation or discrete pancreatic mass
is degraded secondary to inflammatory state and non pancreatic
protocol CT.

Spleen: Normal in appearance

Adrenals/Urinary Tract: There is symmetric enhancement and excretion
of the bilateral kidneys. No definite renal stones on this
postcontrast examination. No discrete renal lesions. No urinary
obstruction or perinephric stranding.

Normal appearance of the bilateral adrenal glands.

Normal appearance of the urinary bladder given degree distention.

Stomach/Bowel: Ingested enteric contrast extends to the level of the
ascending colon. Moderate colonic stool burden without evidence of
enteric obstruction. Scattered colonic diverticulosis without
evidence of diverticulitis. No pneumoperitoneum, pneumatosis or
portal venous gas.

Vascular/Lymphatic: Scattered atherosclerotic plaque within a normal
caliber abdominal aorta. The major branch vessels of the abdominal
aorta appear patent on this non CTA examination. The splenic artery
and vein both of which appear patent on this non CTA examination. No
discrete areas of contrast extravasation.

No bulky retroperitoneal, mesenteric, pelvic or inguinal
lymphadenopathy.

Reproductive: Normal appearance of the pelvic organs. No free fluid
within the pelvic cul-de-sac.

Other: Small bilateral mesenteric fat containing inguinal hernias.

Musculoskeletal: No acute or aggressive osseous abnormalities
IMPRESSION: 1. Grossly similar findings of acute uncomplicated pancreatitis.
While the amount of peripancreatic fluid and stranding has minimally
increased compared to the 12/16/2015 examination, there is still no
definable/drainable abdominal or pelvic fluid collection. No
definitive evidence of pancreatic necrosis.
2. Colonic diverticulosis without evidence of diverticulitis.

## 2017-04-02 ENCOUNTER — Other Ambulatory Visit: Payer: Self-pay | Admitting: Family Medicine

## 2017-04-02 DIAGNOSIS — K219 Gastro-esophageal reflux disease without esophagitis: Secondary | ICD-10-CM

## 2017-04-17 ENCOUNTER — Other Ambulatory Visit: Payer: Self-pay | Admitting: Family Medicine

## 2017-04-17 DIAGNOSIS — J301 Allergic rhinitis due to pollen: Secondary | ICD-10-CM

## 2017-05-19 ENCOUNTER — Other Ambulatory Visit: Payer: Self-pay | Admitting: Family Medicine

## 2017-05-19 DIAGNOSIS — I10 Essential (primary) hypertension: Secondary | ICD-10-CM

## 2017-05-19 NOTE — Telephone Encounter (Signed)
Patient of Bob[facility] please review. KW 

## 2017-05-21 NOTE — Telephone Encounter (Signed)
Please see below, -Consuella Lose, RMA

## 2017-05-21 NOTE — Telephone Encounter (Signed)
NEEDS OFFICE VISIT WITH BOB, hasn't been seen in one year for hypertension. No OV scheduled as of now. Will give one week.

## 2017-05-21 NOTE — Telephone Encounter (Signed)
LMTCB

## 2017-05-21 NOTE — Telephone Encounter (Signed)
Patient advised.

## 2017-05-27 ENCOUNTER — Ambulatory Visit (INDEPENDENT_AMBULATORY_CARE_PROVIDER_SITE_OTHER): Payer: BLUE CROSS/BLUE SHIELD | Admitting: Family Medicine

## 2017-05-27 ENCOUNTER — Encounter: Payer: Self-pay | Admitting: Family Medicine

## 2017-05-27 VITALS — BP 124/92 | HR 72 | Temp 98.6°F | Resp 16 | Wt 224.4 lb

## 2017-05-27 DIAGNOSIS — Z23 Encounter for immunization: Secondary | ICD-10-CM

## 2017-05-27 DIAGNOSIS — I10 Essential (primary) hypertension: Secondary | ICD-10-CM

## 2017-05-27 DIAGNOSIS — E782 Mixed hyperlipidemia: Secondary | ICD-10-CM | POA: Diagnosis not present

## 2017-05-27 MED ORDER — LOSARTAN POTASSIUM 100 MG PO TABS: 100.0000 mg | ORAL_TABLET | Freq: Every day | ORAL | 3 refills | Status: DC

## 2017-05-27 NOTE — Progress Notes (Signed)
Subjective:     Patient ID: Javier Griffin, male   DOB: 03/11/1971, 46 y.o.   MRN: [id number]  HPI  Chief Complaint  Patient presents with  . Hyperlipidemia    Patient returns to office today for follow up from 06/05/16 .  Marland Kitchen Hypertension    Follow up from 06/05/16, patient blood pressure at last visit was 110/74. Patient reports systolic readings ranging from 120-130s and diastolic readings from 80s-90. Patient has good compliance and tolerance on medication.   States neck is feeling better after neurosurgery last year. Continues to be evaluated by Neurosurgery, Dr. [name], for right low back radicular pain. Currently going to [facility] for medication management. States bp has been normal there. Reports coffee intake prior to his visit today. States he occasionally forgets to take fish oil supplements. He has applied for S.S. Disability.   Review of Systems  Respiratory: Negative for shortness of breath.   Cardiovascular: Negative for chest pain and palpitations.       Objective:   Physical Exam  Constitutional: He appears well-developed and well-nourished. No distress.  Cardiovascular: Normal rate and regular rhythm.   Pulmonary/Chest: Breath sounds normal.  Musculoskeletal: He exhibits no edema (of lower extremities).       Assessment:    1. Need for influenza vaccination - Flu Vaccine QUAD 36+ mos IM  2. Need for tetanus booster - Tdap vaccine greater than or equal to 7yo IM  3. Mixed hyperlipidemia - Lipid panel  4. Essential hypertension - COMPLETE METABOLIC PANEL WITH GFR - losartan (COZAAR) 100 MG tablet; Take 1 tablet (100 mg total) by mouth daily.  Dispense: 90 tablet; Refill: 3    Plan:    Further f/u pending lab work.

## 2017-05-27 NOTE — Patient Instructions (Signed)
Please get the labs fasting. We will call you with the lab results. 

## 2017-06-03 ENCOUNTER — Other Ambulatory Visit: Payer: Self-pay | Admitting: Physical Medicine and Rehabilitation

## 2017-06-03 DIAGNOSIS — M545 Low back pain, unspecified: Secondary | ICD-10-CM

## 2017-06-03 DIAGNOSIS — M79604 Pain in right leg: Secondary | ICD-10-CM

## 2017-06-03 DIAGNOSIS — M47817 Spondylosis without myelopathy or radiculopathy, lumbosacral region: Secondary | ICD-10-CM

## 2017-06-07 ENCOUNTER — Ambulatory Visit
Admission: RE | Admit: 2017-06-07 | Discharge: 2017-06-07 | Disposition: A | Discharge status: Home / Self Care | Payer: Medicare Other | Source: Ambulatory Visit | Attending: Physical Medicine and Rehabilitation | Admitting: Physical Medicine and Rehabilitation

## 2017-06-07 DIAGNOSIS — M545 Low back pain, unspecified: Secondary | ICD-10-CM

## 2017-06-07 DIAGNOSIS — M79604 Pain in right leg: Secondary | ICD-10-CM

## 2017-06-07 DIAGNOSIS — M4316 Spondylolisthesis, lumbar region: Secondary | ICD-10-CM | POA: Insufficient documentation

## 2017-06-07 DIAGNOSIS — M47817 Spondylosis without myelopathy or radiculopathy, lumbosacral region: Secondary | ICD-10-CM

## 2017-06-10 ENCOUNTER — Ambulatory Visit: Payer: BLUE CROSS/BLUE SHIELD

## 2017-09-17 ENCOUNTER — Other Ambulatory Visit: Payer: Self-pay | Admitting: Family Medicine

## 2017-09-17 DIAGNOSIS — I10 Essential (primary) hypertension: Secondary | ICD-10-CM

## 2017-10-10 IMAGING — CR DG CERVICAL SPINE 2 OR 3 VIEWS
1 series · 1 of 1 positions shown · non-contrast
Comparison: Cervical spine CT post myelogram November 21, 2015

ADDENDUM:
There is a second cross-table lateral cervical image deck cane
through after the first image time stamped [DATE] and labeled #2.
On this image, there is anterior screw and plate fixation at C3 and
C4 with a disc spacer in this area. Postoperative change posteriorly
at C4 and C5 remains. No spondylolisthesis or fracture.

Note that this study is a two-view cervical spine series, not single
view.
CLINICAL DATA: Anterior cervical decompression
EXAM:
CERVICAL SPINE - 1 VIEW

[xtable]
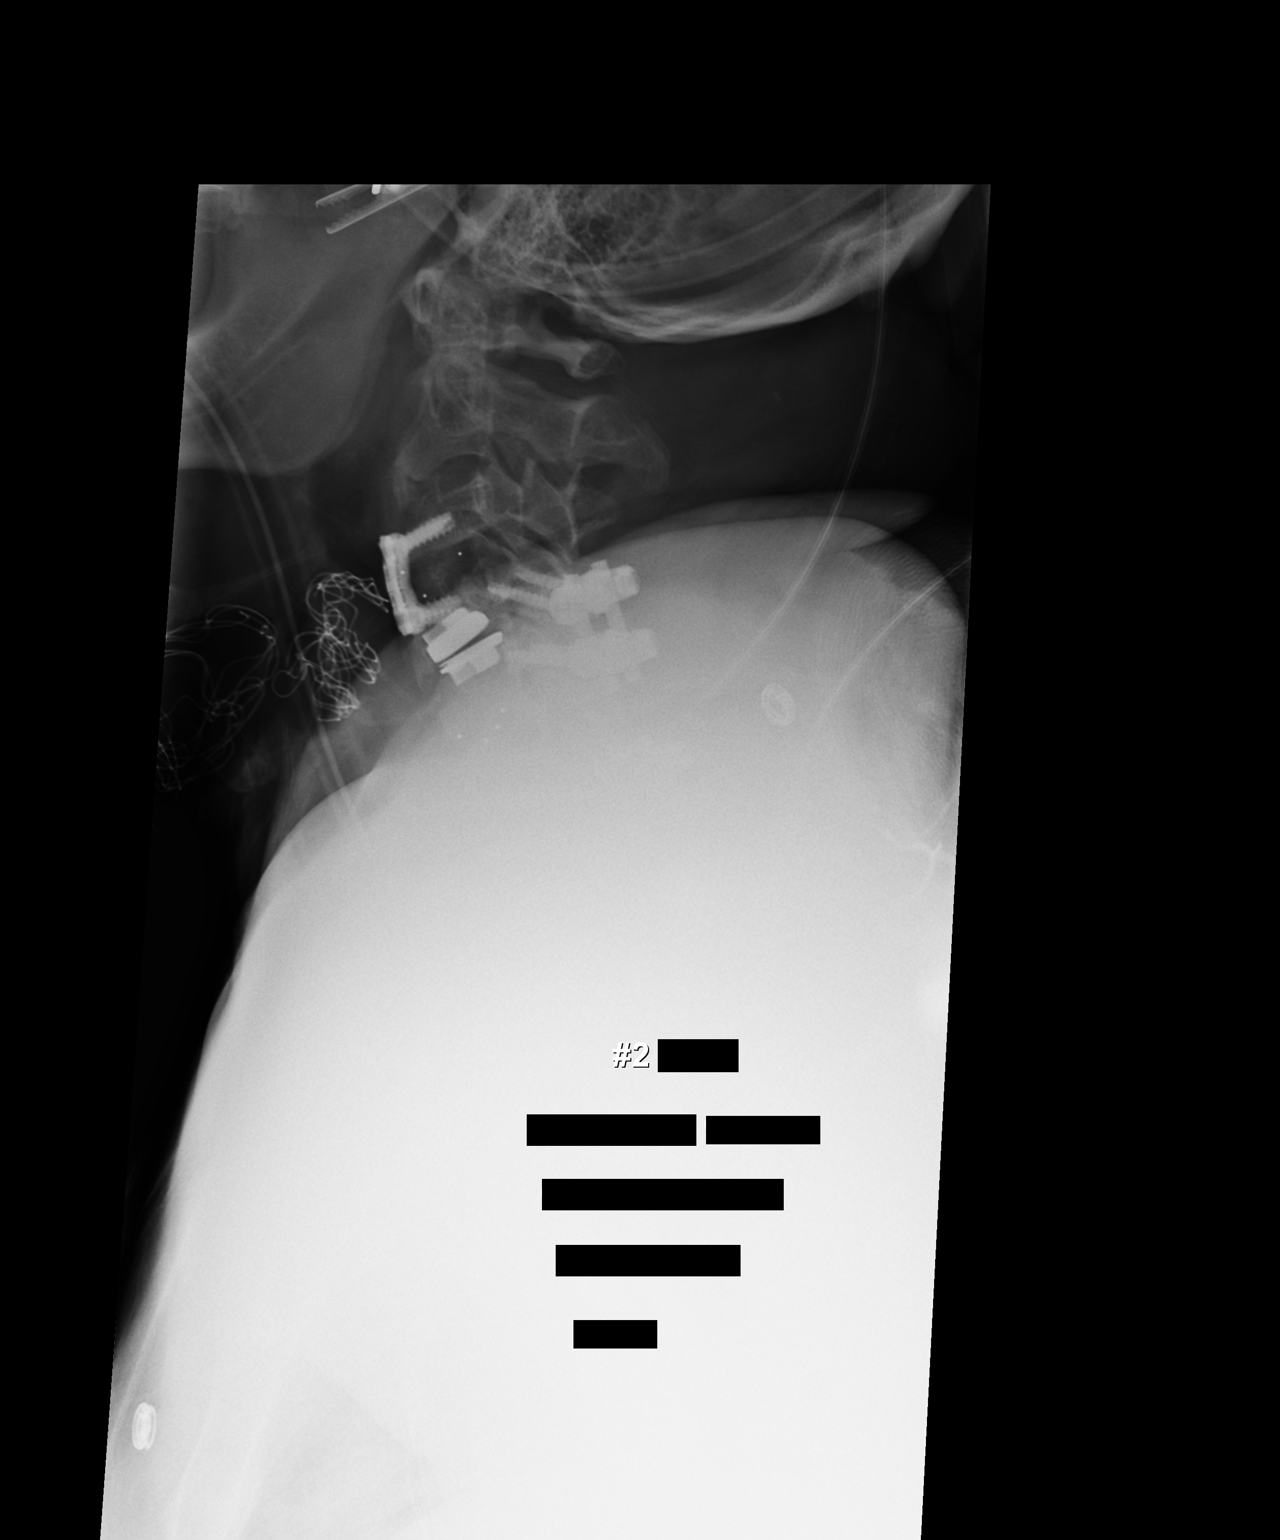

[1 of 1 positions shown; findings below may reference images not displayed]

FINDINGS: Cross-table lateral cervical spine image labeled #1 submitted.
Metallic probe tip is anterior to the C3-4 interspace level. There
is multilevel arthropathy. No fracture or spondylolisthesis evident.
Patient has had previous posterior fusion at C4 and C5.
IMPRESSION: Metallic probe tip anterior to the C3-4 interspace. Postoperative
change noted at C4 and C5.

## 2018-03-29 ENCOUNTER — Other Ambulatory Visit: Payer: Self-pay | Admitting: Family Medicine

## 2018-03-29 DIAGNOSIS — K219 Gastro-esophageal reflux disease without esophagitis: Secondary | ICD-10-CM

## 2018-03-30 ENCOUNTER — Other Ambulatory Visit: Payer: Self-pay | Admitting: Family Medicine

## 2018-03-30 DIAGNOSIS — E781 Pure hyperglyceridemia: Secondary | ICD-10-CM

## 2018-05-01 ENCOUNTER — Other Ambulatory Visit: Payer: Self-pay | Admitting: Family Medicine

## 2018-05-01 DIAGNOSIS — I10 Essential (primary) hypertension: Secondary | ICD-10-CM

## 2018-05-17 ENCOUNTER — Other Ambulatory Visit: Payer: Self-pay | Admitting: Family Medicine

## 2018-05-17 DIAGNOSIS — I10 Essential (primary) hypertension: Secondary | ICD-10-CM

## 2018-06-28 ENCOUNTER — Other Ambulatory Visit: Payer: Self-pay | Admitting: Family Medicine

## 2018-06-28 DIAGNOSIS — E781 Pure hyperglyceridemia: Secondary | ICD-10-CM

## 2018-08-05 ENCOUNTER — Other Ambulatory Visit: Payer: Self-pay | Admitting: Family Medicine

## 2018-08-05 DIAGNOSIS — I10 Essential (primary) hypertension: Secondary | ICD-10-CM

## 2018-10-04 ENCOUNTER — Other Ambulatory Visit: Payer: Self-pay | Admitting: Family Medicine

## 2018-10-04 DIAGNOSIS — K219 Gastro-esophageal reflux disease without esophagitis: Secondary | ICD-10-CM

## 2018-11-30 ENCOUNTER — Other Ambulatory Visit: Payer: Self-pay | Admitting: Family Medicine

## 2018-11-30 DIAGNOSIS — I10 Essential (primary) hypertension: Secondary | ICD-10-CM

## 2018-11-30 MED ORDER — HYDROCHLOROTHIAZIDE 25 MG PO TABS: 25.0000 mg | ORAL_TABLET | Freq: Every day | ORAL | 1 refills | Status: DC

## 2018-11-30 NOTE — Telephone Encounter (Signed)
[facility] faxed refill request for the following medications:  hydrochlorothiazide (HYDRODIURIL) 25 MG tablet  Pt was seeing Nadine Counts. Pt isn't scheduled with another provider in the office. Please advise. Thanks TNP

## 2019-06-11 ENCOUNTER — Other Ambulatory Visit: Payer: Self-pay | Admitting: Family Medicine

## 2019-06-11 DIAGNOSIS — I10 Essential (primary) hypertension: Secondary | ICD-10-CM

## 2019-06-23 ENCOUNTER — Other Ambulatory Visit: Payer: Self-pay | Admitting: Family Medicine

## 2019-06-23 DIAGNOSIS — I10 Essential (primary) hypertension: Secondary | ICD-10-CM

## 2019-06-23 MED ORDER — LOSARTAN POTASSIUM 100 MG PO TABS: 100.0000 mg | ORAL_TABLET | Freq: Every day | ORAL | 0 refills | Status: DC

## 2019-06-23 NOTE — Telephone Encounter (Signed)
Former patient of Mikki Santee who is requesting refill. Patient overdue for follow up. Should I have patient schedule appointment with Adriana or Dr. B, or are you still taking some of Bob's patients?

## 2019-06-23 NOTE — Telephone Encounter (Signed)
[facility] faxed refill request for the following medications:  Losartan Potassium 100 mg tablet  90 day supply LOV: 05/27/2017 saw Mikki Santee. Pt isn't scheduled with another provider in the office. Please advise. Thanks TNP

## 2019-07-17 ENCOUNTER — Other Ambulatory Visit: Payer: Self-pay | Admitting: Family Medicine

## 2019-07-17 DIAGNOSIS — I10 Essential (primary) hypertension: Secondary | ICD-10-CM

## 2019-08-13 ENCOUNTER — Other Ambulatory Visit: Payer: Self-pay | Admitting: Family Medicine

## 2019-08-13 DIAGNOSIS — I10 Essential (primary) hypertension: Secondary | ICD-10-CM

## 2019-09-09 ENCOUNTER — Other Ambulatory Visit: Payer: Self-pay | Admitting: Family Medicine

## 2019-09-09 DIAGNOSIS — I10 Essential (primary) hypertension: Secondary | ICD-10-CM

## 2019-09-14 ENCOUNTER — Other Ambulatory Visit: Payer: Self-pay | Admitting: Family Medicine

## 2019-09-14 DIAGNOSIS — I10 Essential (primary) hypertension: Secondary | ICD-10-CM

## 2019-09-14 NOTE — Telephone Encounter (Signed)
Requested medication (s) are due for refill today: yes  Requested medication (s) are on the active medication list: yes  Last refill:  06/11/2019  Future visit scheduled: no  Notes to clinic: no valid encounter within last 6 months Review for refill   Requested Prescriptions  Pending Prescriptions Disp Refills   hydrochlorothiazide (HYDRODIURIL) 25 MG tablet [Pharmacy Med Name: HYDROCHLOROTHIAZIDE 25 MG TAB] 90 tablet 0    Sig: TAKE 1 TABLET BY MOUTH EVERY DAY      Cardiovascular: Diuretics - Thiazide Failed - 09/14/2019  1:28 AM      Failed - Ca in normal range and within 360 days    Calcium  Date Value Ref Range Status  06/26/2016 9.4 8.9 - 10.3 mg/dL Final          Failed - Cr in normal range and within 360 days    Creatinine, Ser  Date Value Ref Range Status  06/26/2016 0.90 0.61 - 1.24 mg/dL Final          Failed - K in normal range and within 360 days    Potassium  Date Value Ref Range Status  06/26/2016 4.2 3.5 - 5.1 mmol/L Final    Comment:    SLIGHT HEMOLYSIS          Failed - Na in normal range and within 360 days    Sodium  Date Value Ref Range Status  06/26/2016 138 135 - 145 mmol/L Final    Comment:    POST-ULTRACENTRIFUGATION  06/05/2016 140 134 - 144 mmol/L Final          Failed - Last BP in normal range    BP Readings from Last 1 Encounters:  05/27/17 (!) 124/92          Failed - Valid encounter within last 6 months    Recent Outpatient Visits           2 years ago Need for influenza vaccination   [facility] [place], [place]   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

## 2019-10-06 ENCOUNTER — Other Ambulatory Visit: Payer: Self-pay | Admitting: Family Medicine

## 2019-10-06 DIAGNOSIS — I10 Essential (primary) hypertension: Secondary | ICD-10-CM

## 2019-10-06 NOTE — Telephone Encounter (Signed)
Requested medication (s) are due for refill today: yes  Requested medication (s) are on the active medication list: yes  Last refill:  09/09/19  Future visit scheduled: no  Notes to clinic:  no valid encounter within last 6 months    Requested Prescriptions  Pending Prescriptions Disp Refills   losartan (COZAAR) 100 MG tablet [Pharmacy Med Name: LOSARTAN POTASSIUM 100 MG TAB] 30 tablet 1    Sig: TAKE 1 TABLET BY MOUTH EVERY DAY      Cardiovascular:  Angiotensin Receptor Blockers Failed - 10/06/2019  9:32 AM      Failed - Cr in normal range and within 180 days    Creatinine, Ser  Date Value Ref Range Status  06/26/2016 0.90 0.61 - 1.24 mg/dL Final          Failed - K in normal range and within 180 days    Potassium  Date Value Ref Range Status  06/26/2016 4.2 3.5 - 5.1 mmol/L Final    Comment:    SLIGHT HEMOLYSIS          Failed - Last BP in normal range    BP Readings from Last 1 Encounters:  05/27/17 (!) 124/92          Failed - Valid encounter within last 6 months    Recent Outpatient Visits           2 years ago Need for influenza vaccination   [facility] [place], [place]   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

## 2019-10-29 ENCOUNTER — Ambulatory Visit: Payer: Self-pay | Attending: Internal Medicine

## 2019-10-29 NOTE — Progress Notes (Signed)
   Covid-19 Vaccination Clinic  Name:  Javier Griffin    MRN: [id number] DOB: [birth year]  10/29/2019

## 2019-11-09 ENCOUNTER — Other Ambulatory Visit: Payer: Self-pay | Admitting: Family Medicine

## 2019-11-09 DIAGNOSIS — I10 Essential (primary) hypertension: Secondary | ICD-10-CM

## 2019-11-09 NOTE — Telephone Encounter (Signed)
Requested medication (s) are due for refill today:   Yes  Requested medication (s) are on the active medication list:   Yes  Future visit scheduled:   No   Not been seen since 05/27/2017.  Attempts have been made to contact him for an appt.   Last ordered: 09/09/2019 by Dr. Sherrie Mustache.    Clinic note:  No valid encounters during last 6 months.  Provider to review for refill.   Requested Prescriptions  Pending Prescriptions Disp Refills   hydrochlorothiazide (HYDRODIURIL) 25 MG tablet [Pharmacy Med Name: HYDROCHLOROTHIAZIDE 25 MG TAB] 90 tablet 0    Sig: TAKE 1 TABLET BY MOUTH EVERY DAY      Cardiovascular: Diuretics - Thiazide Failed - 11/09/2019 12:31 PM      Failed - Ca in normal range and within 360 days    Calcium  Date Value Ref Range Status  06/26/2016 9.4 8.9 - 10.3 mg/dL Final          Failed - Cr in normal range and within 360 days    Creatinine, Ser  Date Value Ref Range Status  06/26/2016 0.90 0.61 - 1.24 mg/dL Final          Failed - K in normal range and within 360 days    Potassium  Date Value Ref Range Status  06/26/2016 4.2 3.5 - 5.1 mmol/L Final    Comment:    SLIGHT HEMOLYSIS          Failed - Na in normal range and within 360 days    Sodium  Date Value Ref Range Status  06/26/2016 138 135 - 145 mmol/L Final    Comment:    POST-ULTRACENTRIFUGATION  06/05/2016 140 134 - 144 mmol/L Final          Failed - Last BP in normal range    BP Readings from Last 1 Encounters:  05/27/17 (!) 124/92          Failed - Valid encounter within last 6 months    Recent Outpatient Visits           2 years ago Need for influenza vaccination   [facility] [place], [place]   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

## 2019-11-10 ENCOUNTER — Other Ambulatory Visit: Payer: Self-pay | Admitting: Family Medicine

## 2019-11-10 DIAGNOSIS — I10 Essential (primary) hypertension: Secondary | ICD-10-CM

## 2019-11-10 NOTE — Telephone Encounter (Signed)
Requested medication (s) are due for refill today:  yes  Requested medication (s) are on the active medication list:  yes  Future visit scheduled:  No  Last Refill: 09/09/19; #30; RF x 1  Note to clinic:  Phone call to pt.  Advised he is overdue for office f/u.  Pt. Stated he will check his schedule and call back to set up an appt.  Please advise, as courtesy refills have been given, previously.    Requested Prescriptions  Pending Prescriptions Disp Refills   losartan (COZAAR) 100 MG tablet [Pharmacy Med Name: LOSARTAN POTASSIUM 100 MG TAB] 30 tablet 1    Sig: TAKE 1 TABLET BY MOUTH EVERY DAY      Cardiovascular:  Angiotensin Receptor Blockers Failed - 11/10/2019 12:41 PM      Failed - Cr in normal range and within 180 days    Creatinine, Ser  Date Value Ref Range Status  06/26/2016 0.90 0.61 - 1.24 mg/dL Final          Failed - K in normal range and within 180 days    Potassium  Date Value Ref Range Status  06/26/2016 4.2 3.5 - 5.1 mmol/L Final    Comment:    SLIGHT HEMOLYSIS          Failed - Last BP in normal range    BP Readings from Last 1 Encounters:  05/27/17 (!) 124/92          Failed - Valid encounter within last 6 months    Recent Outpatient Visits           2 years ago Need for influenza vaccination   [facility] [place], [place]   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 [place], [place], [place]   3 years ago Acute alcoholic pancreatitis   [facility] [place], [place], [place]   3 years ago Left upper quadrant pain   [facility] Maple Hudson., MD              Passed - Patient is not pregnant

## 2019-11-25 ENCOUNTER — Other Ambulatory Visit: Payer: Self-pay | Admitting: Family Medicine

## 2019-11-25 DIAGNOSIS — I10 Essential (primary) hypertension: Secondary | ICD-10-CM

## 2019-11-30 ENCOUNTER — Ambulatory Visit: Payer: Self-pay

## 2019-12-02 ENCOUNTER — Other Ambulatory Visit: Payer: Self-pay | Admitting: Family Medicine

## 2019-12-02 DIAGNOSIS — I10 Essential (primary) hypertension: Secondary | ICD-10-CM

## 2019-12-02 NOTE — Telephone Encounter (Signed)
Requested  medications are  due for refill today yes  Requested medications are on the active medication list yes  Last refill 2/28  Future visit scheduled no  Notes to clinic failed protocol due to last visit in 2018

## 2019-12-16 ENCOUNTER — Other Ambulatory Visit: Payer: Self-pay | Admitting: Family Medicine

## 2019-12-16 DIAGNOSIS — I10 Essential (primary) hypertension: Secondary | ICD-10-CM

## 2019-12-16 NOTE — Telephone Encounter (Signed)
Requested  medications are  due for refill today yes  Requested medications are on the active medication list yes  Last refill 2/28  Future visit scheduled no  Last visit 2 yrs ago  Notes to clinic Failed protocol due to no visit in more than 6 months

## 2019-12-23 ENCOUNTER — Other Ambulatory Visit: Payer: Self-pay | Admitting: Family Medicine

## 2019-12-23 DIAGNOSIS — I10 Essential (primary) hypertension: Secondary | ICD-10-CM

## 2019-12-23 NOTE — Telephone Encounter (Signed)
Requested  medications are  due for refill today yes  Requested medications are on the active medication list yes  Last refill 2/28  Future visit scheduled no  Notes to clinic already had a curtesy refill, canceled appt.

## 2020-01-08 ENCOUNTER — Other Ambulatory Visit: Payer: Self-pay | Admitting: Family Medicine

## 2020-01-08 DIAGNOSIS — I10 Essential (primary) hypertension: Secondary | ICD-10-CM

## 2020-01-08 NOTE — Telephone Encounter (Signed)
Requested medication (s) are due for refill today: yes  Requested medication (s) are on the active medication list: yes  Last refill:  09/09/19  Future visit scheduled: no  Notes to clinic:  called pt and LM on VM to make appt; last OV > 3 months ago   Requested Prescriptions  Pending Prescriptions Disp Refills   losartan (COZAAR) 100 MG tablet [Pharmacy Med Name: LOSARTAN POTASSIUM 100 MG TAB] 30 tablet 1    Sig: TAKE 1 TABLET BY MOUTH EVERY DAY      Cardiovascular:  Angiotensin Receptor Blockers Failed - 01/08/2020  1:15 PM      Failed - Cr in normal range and within 180 days    Creatinine, Ser  Date Value Ref Range Status  06/26/2016 0.90 0.61 - 1.24 mg/dL Final          Failed - K in normal range and within 180 days    Potassium  Date Value Ref Range Status  06/26/2016 4.2 3.5 - 5.1 mmol/L Final    Comment:    SLIGHT HEMOLYSIS          Failed - Last BP in normal range    BP Readings from Last 1 Encounters:  05/27/17 (!) 124/92          Failed - Valid encounter within last 6 months    Recent Outpatient Visits           2 years ago Need for influenza vaccination   [facility] [place], [place]   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

## 2020-02-16 ENCOUNTER — Other Ambulatory Visit: Payer: Self-pay

## 2020-02-16 ENCOUNTER — Other Ambulatory Visit: Payer: Self-pay | Admitting: Physical Medicine and Rehabilitation

## 2020-02-16 ENCOUNTER — Ambulatory Visit
Admission: RE | Admit: 2020-02-16 | Discharge: 2020-02-16 | Disposition: A | Discharge status: Home / Self Care | Payer: Medicare Other | Source: Ambulatory Visit | Attending: Physical Medicine and Rehabilitation | Admitting: Physical Medicine and Rehabilitation

## 2020-02-16 ENCOUNTER — Ambulatory Visit
Admission: RE | Admit: 2020-02-16 | Discharge: 2020-02-16 | Disposition: A | Discharge status: Home / Self Care | Payer: Medicare Other | Attending: Physical Medicine and Rehabilitation | Admitting: Physical Medicine and Rehabilitation

## 2020-02-16 DIAGNOSIS — R52 Pain, unspecified: Secondary | ICD-10-CM

## 2020-03-13 ENCOUNTER — Other Ambulatory Visit: Payer: Self-pay | Admitting: Physical Medicine and Rehabilitation

## 2020-03-13 DIAGNOSIS — M25561 Pain in right knee: Secondary | ICD-10-CM

## 2020-04-01 ENCOUNTER — Ambulatory Visit
Admission: RE | Admit: 2020-04-01 | Discharge: 2020-04-01 | Disposition: A | Discharge status: Home / Self Care | Payer: Medicare Other | Source: Ambulatory Visit | Attending: Physical Medicine and Rehabilitation | Admitting: Physical Medicine and Rehabilitation

## 2020-04-01 DIAGNOSIS — M25561 Pain in right knee: Secondary | ICD-10-CM

## 2021-03-28 ENCOUNTER — Other Ambulatory Visit: Payer: Self-pay | Admitting: Physical Medicine and Rehabilitation

## 2021-03-28 DIAGNOSIS — M5416 Radiculopathy, lumbar region: Secondary | ICD-10-CM

## 2021-04-05 ENCOUNTER — Ambulatory Visit
Admission: RE | Admit: 2021-04-05 | Discharge: 2021-04-05 | Disposition: A | Discharge status: Home / Self Care | Payer: Medicare Other | Source: Ambulatory Visit | Attending: Physical Medicine and Rehabilitation | Admitting: Physical Medicine and Rehabilitation

## 2021-04-05 ENCOUNTER — Other Ambulatory Visit: Payer: Self-pay

## 2021-04-05 DIAGNOSIS — M5416 Radiculopathy, lumbar region: Secondary | ICD-10-CM | POA: Insufficient documentation

## 2023-10-20 ENCOUNTER — Ambulatory Visit: Payer: Self-pay | Admitting: Family Medicine

## 2023-10-20 NOTE — Telephone Encounter (Signed)
  Chief Complaint: High Blood pressure 200/110 Symptoms: asymptomatic-patient reports increased stress due to death of mother and chronic pain Frequency: blood pressure occurred today in pain management office Pertinent Negatives: Patient denies CP, SOB, headache, weakness Disposition: [] ED /[x] Urgent Care (no appt availability in office) / [] Appointment(In office/virtual)/ []  [place] [facility]/ [] Home Care/ [] Refused Recommended Disposition /[] [place] [facility]/ []  Follow-up with PCP Additional Notes: patient wife initially called with concerns about patient's BP after pain management clinic visit today. On her call, she asked if his new patient appointment could be move to a closer available date. Patient called and stated BP was 200/110 with no symptoms of CP, SOB, headache or weakness. Patient states he thinks the increased stress of the recent death of mother and chronic pain in neck may have something to do with increased BP. Patient was on BP medication at one time but was taken off a few years ago. Patient was updated on being unable to get patient a sooner appointment. Per protocol, patient is recommended to Urgent Care for BP. Patient states he is going to see if he can get in to be seen at the [place]. Patient verbalized understanding and all questions answered.      Summary: bp elevated (number unknown)   Copied From CRM [id number]. Reason for Triage: wife Misty Stanley is calling to ask if pt can get a sooner appt than April 18. Pt used to be a pt here at [facility], but has not been seen since 2018.  She doesn't know why he is considered "new". I explained this to her.   She said his pain management dr saw him this am and told him his bp is very high. And he needed to see his pcp asap.  She doesn't know how high it was. Pt is at home, she is at work. Misty Stanley calling to ask for an earlier appt.     Reason for Disposition  [1] Systolic BP  >= 200 OR Diastolic >= 120 AND [2] having NO cardiac or  neurologic symptoms  Answer Assessment - Initial Assessment Questions 1. BLOOD PRESSURE: "What is the blood pressure?" "Did you take at least two measurements 5 minutes apart?"     200/110 2. ONSET: "When did you take your blood pressure?"     Blood pressure was taken during pain management office 3. HOW: "How did you take your blood pressure?" (e.g., automatic home BP monitor, visiting nurse)     In doctor's office 4. HISTORY: "Do you have a history of high blood pressure?"     yes 5. MEDICINES: "Are you taking any medicines for blood pressure?" "Have you missed any doses recently?"     Hasn't taken medications for a few years 6. OTHER SYMPTOMS: "Do you have any symptoms?" (e.g., blurred vision, chest pain, difficulty breathing, headache, weakness)     Denies symptoms of high BP  Protocols used: Blood Pressure - High-A-AH

## 2023-12-05 ENCOUNTER — Encounter: Payer: Self-pay | Admitting: Family Medicine

## 2023-12-05 ENCOUNTER — Ambulatory Visit: Admitting: Family Medicine

## 2023-12-05 VITALS — BP 229/151 | HR 92 | Ht 72.0 in | Wt 220.0 lb

## 2023-12-05 DIAGNOSIS — R0683 Snoring: Secondary | ICD-10-CM | POA: Insufficient documentation

## 2023-12-05 DIAGNOSIS — Z7689 Persons encountering health services in other specified circumstances: Secondary | ICD-10-CM

## 2023-12-05 DIAGNOSIS — I10 Essential (primary) hypertension: Secondary | ICD-10-CM

## 2023-12-05 DIAGNOSIS — M7021 Olecranon bursitis, right elbow: Secondary | ICD-10-CM | POA: Insufficient documentation

## 2023-12-05 DIAGNOSIS — J301 Allergic rhinitis due to pollen: Secondary | ICD-10-CM | POA: Diagnosis not present

## 2023-12-05 DIAGNOSIS — Z1211 Encounter for screening for malignant neoplasm of colon: Secondary | ICD-10-CM

## 2023-12-05 DIAGNOSIS — Z1212 Encounter for screening for malignant neoplasm of rectum: Secondary | ICD-10-CM

## 2023-12-05 DIAGNOSIS — I160 Hypertensive urgency: Secondary | ICD-10-CM | POA: Diagnosis not present

## 2023-12-05 MED ORDER — LOSARTAN POTASSIUM 100 MG PO TABS: 100.0000 mg | ORAL_TABLET | Freq: Every day | ORAL | 1 refills | Status: DC

## 2023-12-05 MED ORDER — LOSARTAN POTASSIUM 50 MG PO TABS: 50.0000 mg | ORAL_TABLET | Freq: Every day | ORAL | 0 refills | Status: DC

## 2023-12-05 MED ORDER — FLUTICASONE PROPIONATE 50 MCG/ACT NA SUSP: 2.0000 | Freq: Every day | NASAL | 1 refills | Status: DC

## 2023-12-05 NOTE — Patient Instructions (Signed)

## 2023-12-05 NOTE — Progress Notes (Signed)
 New patient visit   Patient: Javier Griffin   DOB: 03-21-1971   53 y.o. Male  MRN: [id number] Visit Date: 12/05/2023  Today's healthcare provider: Carlean Charter, DO   Chief Complaint  Patient presents with   Establish Care    No concerns    Subjective    Javier Griffin is a 53 y.o. male who presents today as a new patient to establish care.  HPI HPI     Establish Care    Additional comments: No concerns       Last edited by Bart Lieu, CMA on 12/05/2023  8:39 AM.      Javier Griffin is a 53 year old male with hypertension who presents for reestablishing care and blood pressure management.  He has a history of hypertension, previously managed with losartan  after experiencing adverse effects with amlodipine , which caused ankle swelling, and lisinopril , which caused cough.  He reports his blood pressure readings had been around 140-150/80 mmHg, but it was elevated following his mother's passing on February 23rd, which he attributes to stress. His blood pressure was high when checked after his mother's funeral (he reports a systolic around 170). No current symptoms such as headaches, chest pain, or acute vision changes, although he acknowledges some vision changes that are not acute.  He has a history of six neck surgeries and attends [facility] in [place] for related issues.  He is also dealing with bursitis in his elbow. An orthopedic specialist has evaluated the condition and determined it is not severe enough to require drainage at this time.   Past Medical History:  Diagnosis Date   Arthritis    Bronchitis    GERD (gastroesophageal reflux disease)    Headache    due to neck pain   Hypertension    treated at one time, no medications at this time   Neck injury    Past Surgical History:  Procedure Laterality Date   ANTERIOR CERVICAL DECOMP/DISCECTOMY FUSION N/A 04/22/2013   Procedure: ANTERIOR CERVICAL DECOMPRESSION/DISCECTOMY FUSION 1  CERVICAL SEVEN-THORACIC ONELEVEL/HARDWARE REMOVAL;  Surgeon: Elder Greening, MD;  Location: [facility];  Service: Neurosurgery;  Laterality: N/A;  C7/T1 anterior cervical decompression with fusion interbody prothesis plating and bonegraft with removal of codman slim loc plate   ANTERIOR CERVICAL DECOMP/DISCECTOMY FUSION N/A 07/04/2016   Procedure: ANTERIOR CERVICAL DECOMPRESSION/DISCECTOMY FUSION, INTERBODY PROSTHESIS,PLATE Cervical three-four;  Surgeon: Garry Kansas, MD;  Location: [facility] OR;  Service: Neurosurgery;  Laterality: N/A;   carpel tunnel Left    and elbow    CERVICAL SPINE SURGERY     x3   ELBOW SURGERY Left    nerve release   POSTERIOR CERVICAL FUSION/FORAMINOTOMY N/A 05/26/2014   Procedure: Cervical four-five, Cervical seven-Thoracic one Posterior Cervical Instrumentation and Fusion;  Surgeon: Garry Kansas, MD;  Location: [facility];  Service: Neurosurgery;  Laterality: N/A;   POSTERIOR CERVICAL LAMINECTOMY N/A 02/08/2015   Procedure: Removal of Thoracic one Pedicle Screw;  Surgeon: Garry Kansas, MD;  Location: [facility];  Service: Neurosurgery;  Laterality: N/A;  removal of T1 pedicle screw   WISDOM TOOTH EXTRACTION     Family Status  Relation Name Status   Mother  Deceased   Father  Alive   MGM  Deceased   MGF  Deceased   PGF  Deceased  No partnership data on file   Family History  Problem Relation Age of Onset   Lung cancer Mother    Hypertension Father  Emphysema Maternal Grandfather    Heart disease Paternal Grandfather    Social History   Socioeconomic History   Marital status: Married    Spouse name: Not on file   Number of children: Not on file   Years of education: Not on file   Highest education level: Not on file  Occupational History   Not on file  Tobacco Use   Smoking status: Never   Smokeless tobacco: Former    Types: Snuff    Quit date: 05/24/1992  Substance and Sexual Activity   Alcohol use: Yes    Alcohol/week: 8.0  standard drinks of alcohol    Types: 8 Cans of beer per week   Drug use: No   Sexual activity: Not Currently  Other Topics Concern   Not on file  Social History Narrative   Not on file   Social Drivers of Health   Financial Resource Strain: Low Risk  (12/05/2023)   Overall Financial Resource Strain (CARDIA)    Difficulty of Paying Living Expenses: Not hard at all  Food Insecurity: No Food Insecurity (12/05/2023)   Hunger Vital Sign    Worried About Running Out of Food in the Last Year: Never true    Ran Out of Food in the Last Year: Never true  Transportation Needs: No Transportation Needs (12/05/2023)   PRAPARE - Administrator, Civil Service (Medical): No    Lack of Transportation (Non-Medical): No  Physical Activity: Not on file  Stress: No Stress Concern Present (12/05/2023)   [employer] of Occupational Health - Occupational Stress Questionnaire    Feeling of Stress : Only a little  Social Connections: Not on file   Outpatient Medications Prior to Visit  Medication Sig   baclofen (LIORESAL) 10 MG tablet Take 5-10 mg by mouth 3 (three) times daily as needed.   pregabalin  (LYRICA ) 150 MG capsule Take 150 mg by mouth every 12 (twelve) hours.   gemfibrozil  (LOPID ) 600 MG tablet TAKE 1 TABLET (600 MG TOTAL) BY MOUTH 2 (TWO) TIMES DAILY BEFORE A MEAL. (Patient not taking: Reported on 12/05/2023)   hydrochlorothiazide  (HYDRODIURIL ) 25 MG tablet TAKE 1 TABLET BY MOUTH EVERY DAY (Patient not taking: Reported on 12/05/2023)   [DISCONTINUED] cyclobenzaprine  (FLEXERIL ) 10 MG tablet Take 1 tablet (10 mg total) by mouth 3 (three) times daily as needed for muscle spasms. (Patient not taking: Reported on 12/05/2023)   [DISCONTINUED] docusate sodium  (COLACE) 100 MG capsule Take 1 capsule (100 mg total) by mouth 2 (two) times daily. (Patient not taking: Reported on 12/05/2023)   [DISCONTINUED] fluticasone  (FLONASE ) 50 MCG/ACT nasal spray PLACE 2 SPRAYS INTO BOTH NOSTRILS DAILY.  (Patient not taking: Reported on 12/05/2023)   [DISCONTINUED] gabapentin  (NEURONTIN ) 300 MG capsule TAKE 1 CAPSULE BY ORAL ROUTE 3 TIMES EVERY DAY (Patient not taking: Reported on 12/05/2023)   [DISCONTINUED] lansoprazole  (PREVACID ) 30 MG capsule TAKE ONE CAPSULE DAILY AS NEEDED FOR ACID REFLUX (Patient not taking: Reported on 12/05/2023)   [DISCONTINUED] losartan  (COZAAR ) 100 MG tablet TAKE 1 TABLET BY MOUTH EVERY DAY (Patient not taking: Reported on 12/05/2023)   [DISCONTINUED] MORPHABOND  ER 30 MG T12A TAKE 1 TABLET BY MOUTH EVERY TWELVE HOURS (Patient not taking: Reported on 12/05/2023)   [DISCONTINUED] traMADol (ULTRAM) 50 MG tablet Take 50 mg by mouth 2 (two) times daily as needed. for pain (Patient not taking: Reported on 12/05/2023)   No facility-administered medications prior to visit.   Allergies  Allergen Reactions   Amlodipine   Leg swelling   Lisinopril  Cough    Immunization History  Administered Date(s) Administered   Influenza,inj,Quad PF,6+ Mos 05/28/2014, 05/29/2015, 05/27/2017   Tdap 05/27/2017    Health Maintenance  Topic Date Due   Medicare Annual Wellness (AWV)  Never done   Colonoscopy  Never done   Zoster Vaccines- Shingrix (1 of 2) 12/15/2023 (Originally 09/26/2022)   COVID-19 Vaccine (1 - 2024-25 season) 05/19/2024 (Originally 04/20/2023)   INFLUENZA VACCINE  03/19/2024   DTaP/Tdap/Td (2 - Td or Tdap) 05/28/2027   HPV VACCINES  Aged Out   Meningococcal B Vaccine  Aged Out   Hepatitis C Screening  Discontinued   HIV Screening  Discontinued    Patient Care Team: Kymere Fullington, Asencion Blacksmith, DO as PCP - General (Family Medicine)  Review of Systems  Eyes:  Negative for visual disturbance.  Respiratory: Negative.  Negative for cough, shortness of breath and wheezing.   Cardiovascular:  Negative for chest pain, palpitations and leg swelling.  Gastrointestinal:  Negative for abdominal pain, nausea and vomiting.  Neurological:  Negative for weakness and headaches.         Objective    BP (!) 229/151   Pulse 92   Ht 6' (1.829 m)   Wt 220 lb (99.8 kg)   SpO2 98%   BMI 29.84 kg/m     Physical Exam Vitals reviewed.  Constitutional:      General: He is not in acute distress.    Appearance: Normal appearance. He is not diaphoretic.  HENT:     Head: Normocephalic and atraumatic.  Eyes:     General: No scleral icterus.    Conjunctiva/sclera: Conjunctivae normal.  Cardiovascular:     Rate and Rhythm: Normal rate and regular rhythm.     Pulses: Normal pulses.     Heart sounds: Normal heart sounds. No murmur heard. Pulmonary:     Effort: Pulmonary effort is normal. No respiratory distress.     Breath sounds: Normal breath sounds. No wheezing or rhonchi.  Musculoskeletal:     Cervical back: Neck supple.     Right lower leg: No edema.     Left lower leg: No edema.  Lymphadenopathy:     Cervical: No cervical adenopathy.  Skin:    General: Skin is warm and dry.     Findings: No rash.  Neurological:     Mental Status: He is alert and oriented to person, place, and time. Mental status is at baseline.  Psychiatric:        Mood and Affect: Mood normal.        Behavior: Behavior normal.     Depression Screen    12/05/2023    8:42 AM 05/27/2017   10:24 AM 06/16/2015   12:29 PM 06/16/2015   12:28 PM  PHQ 2/9 Scores  PHQ - 2 Score 0 0 0 0  PHQ- 9 Score 3  0    No results found for any visits on 12/05/23.  Assessment & Plan     Asymptomatic hypertensive urgency -     EKG 12-Lead  Establishing care with new doctor, encounter for  Essential hypertension -     Comprehensive metabolic panel with GFR -     Lipid panel -     Losartan  Potassium; Take 1 tablet (50 mg total) by mouth daily for 1 day.  Dispense: 1 tablet; Refill: 0 -     Losartan  Potassium; Take 1 tablet (100 mg total) by mouth daily.  Dispense: 30 tablet; Refill: 1  Encounter for colorectal cancer screening -     Ambulatory referral to Gastroenterology  Seasonal allergic  rhinitis due to pollen -     Fluticasone  Propionate; Place 2 sprays into both nostrils daily.  Dispense: 48 g; Refill: 1  Loud snoring -     Home sleep test   Essential hypertension; asymptomatic hypertensive urgency Hypertension currently not taking any blood pressure medications. Recent stress has severely elevated blood pressure, placing him at high risk for heart attack/stroke.  Strongly recommended patient proceed to the ER; however, patient is unwilling to go to the ER. - EKG ordered: EKG shows normal sinus rhythm with probable left ventricular hypertrophy, without ectopy and without ST elevation.  Rate 75, PR interval 162, QRS 84, QT/QTc 398/428. - Monitor blood pressure closely at home. - Restart losartan  with 1 dose of losartan  50 mg today, followed by losartan  100 mg beginning tomorrow.  Discussed with patient intention to slowly reduce blood pressure, as decreasing his blood pressure extremely quickly can also lead to stroke, due to reduced perfusion to the brain. - Return in 1 week for recheck; will likely add on hydrochlorothiazide  at that time - Strongly emphasized low threshold to proceed to the emergency department if he becomes symptomatic with chest pain, shortness of breath, headaches, weakness, vision changes or any other concerning symptoms.  Olecranon Bursitis Previously evaluated by orthopedics with no current indication for drainage. No significant symptoms reported. - Monitor for changes in swelling or pain. - Consult orthopedics if symptoms worsen.  Loud snoring Patient has been informed by his wife and children that he snores very loudly.  He notes he has woken himself up from his sleep due to his snoring. - Order home sleep study to evaluate for sleep apnea   Return in about 1 week (around 12/12/2023) for HTN.     I discussed the assessment and treatment plan with the patient  The patient was provided an opportunity to ask questions and all were answered. The  patient agreed with the plan and demonstrated an understanding of the instructions.   The patient was advised to call back or seek an in-person evaluation if the symptoms worsen or if the condition fails to improve as anticipated.    Carlean Charter, DO  [facility] [phone] (phone) [phone] (fax)  [facility]

## 2023-12-12 ENCOUNTER — Ambulatory Visit: Admitting: Family Medicine

## 2023-12-12 ENCOUNTER — Encounter: Payer: Self-pay | Admitting: Family Medicine

## 2023-12-12 VITALS — BP 217/139 | HR 71 | Ht 72.0 in | Wt 223.7 lb

## 2023-12-12 DIAGNOSIS — E782 Mixed hyperlipidemia: Secondary | ICD-10-CM | POA: Diagnosis not present

## 2023-12-12 DIAGNOSIS — E781 Pure hyperglyceridemia: Secondary | ICD-10-CM

## 2023-12-12 DIAGNOSIS — F1020 Alcohol dependence, uncomplicated: Secondary | ICD-10-CM

## 2023-12-12 DIAGNOSIS — I10 Essential (primary) hypertension: Secondary | ICD-10-CM | POA: Diagnosis not present

## 2023-12-12 DIAGNOSIS — M5412 Radiculopathy, cervical region: Secondary | ICD-10-CM

## 2023-12-12 MED ORDER — HYDROCHLOROTHIAZIDE 25 MG PO TABS: 25.0000 mg | ORAL_TABLET | Freq: Every day | ORAL | 0 refills | Status: DC

## 2023-12-12 MED ORDER — GEMFIBROZIL 600 MG PO TABS: 600.0000 mg | ORAL_TABLET | Freq: Two times a day (BID) | ORAL | 0 refills | Status: DC

## 2023-12-12 MED ORDER — CARVEDILOL 6.25 MG PO TABS: 6.2500 mg | ORAL_TABLET | Freq: Two times a day (BID) | ORAL | 0 refills | Status: DC

## 2023-12-12 NOTE — Patient Instructions (Addendum)
 Start hydrochlorothiazide  as soon as you pick it up today Start carvedilol on Tuesday.

## 2023-12-12 NOTE — Progress Notes (Signed)
 Established patient visit   Patient: Javier Griffin   DOB: 10-11-1970   53 y.o. Male  MRN: [id number] Visit Date: 12/12/2023  Today's healthcare provider: Carlean Charter, DO   Chief Complaint  Patient presents with   Medical Management of Chronic Issues   Hypertension   Subjective    Hypertension Pertinent negatives include no chest pain, headaches, palpitations or shortness of breath.   Javier Griffin is a 53 year old male with hypertension who presents for medication management and follow-up.  He started losartan  at an increased dose of 100 mg, which has improved his symptoms, specifically reducing the sensation of 'pounding'. He has not completed the recommended blood work and cannot check his blood pressure at home as he cannot find his blood pressure monitor. No chest pain, headaches, vision changes, or dizziness. He notes chronic vision changes over the years but not acutely.  He was prescribed prednisone . He experiences a noticeable effect from the prednisone  approximately two hours after taking it, including a sensation of 'boom, boom' and difficulty sleeping. He also mentions that his blood pressure feels different when there is a gap in taking the prednisone .  He consumes alcohol regularly, drinking beer four or more times a week (daily or almost daily), with an average of seven to eight drinks per occasion.   Follows with [facility] - Dr. Adrien Alberta - Lyrica  and baclofen, as well as back and neck injections      Medications: Outpatient Medications Prior to Visit  Medication Sig Note   baclofen (LIORESAL) 10 MG tablet Take 5-10 mg by mouth 3 (three) times daily as needed.    fluticasone  (FLONASE ) 50 MCG/ACT nasal spray Place 2 sprays into both nostrils daily.    losartan  (COZAAR ) 100 MG tablet Take 1 tablet (100 mg total) by mouth daily.    pregabalin  (LYRICA ) 150 MG capsule Take 150 mg by mouth every 12 (twelve) hours.    [DISCONTINUED] gemfibrozil   (LOPID ) 600 MG tablet TAKE 1 TABLET (600 MG TOTAL) BY MOUTH 2 (TWO) TIMES DAILY BEFORE A MEAL. (Patient not taking: Reported on 12/12/2023)    [DISCONTINUED] hydrochlorothiazide  (HYDRODIURIL ) 25 MG tablet TAKE 1 TABLET BY MOUTH EVERY DAY (Patient not taking: Reported on 12/12/2023)    [DISCONTINUED] losartan  (COZAAR ) 50 MG tablet Take 1 tablet (50 mg total) by mouth daily for 1 day. 12/12/2023: single dose   No facility-administered medications prior to visit.    Review of Systems  Eyes:  Negative for visual disturbance.  Respiratory: Negative.  Negative for cough, shortness of breath and wheezing.   Cardiovascular:  Negative for chest pain, palpitations and leg swelling.  Neurological:  Negative for dizziness, weakness, light-headedness and headaches.         Objective    BP (!) 217/139 (BP Location: Left Arm, Patient Position: Sitting, Cuff Size: Normal)   Pulse 71   Ht 6' (1.829 m)   Wt 223 lb 11.2 oz (101.5 kg)   SpO2 100%   BMI 30.34 kg/m     Physical Exam Vitals and nursing note reviewed.  Constitutional:      General: He is not in acute distress.    Appearance: Normal appearance.  HENT:     Head: Normocephalic and atraumatic.  Eyes:     General: No scleral icterus.    Conjunctiva/sclera: Conjunctivae normal.  Cardiovascular:     Rate and Rhythm: Normal rate.  Pulmonary:     Effort: Pulmonary effort is normal.  Neurological:     Mental Status: He is alert and oriented to person, place, and time. Mental status is at baseline.  Psychiatric:        Mood and Affect: Mood normal.        Behavior: Behavior normal.      No results found for any visits on 12/12/23.  Assessment & Plan    Mixed hyperlipidemia -     Lipoprotein A (LPA)  Essential hypertension -     hydroCHLOROthiazide ; Take 1 tablet (25 mg total) by mouth daily.  Dispense: 90 tablet; Refill: 0 -     Carvedilol; Take 1 tablet (6.25 mg total) by mouth 2 (two) times daily with a meal. Start 3 days  after hydrochlorothiazide   Dispense: 60 tablet; Refill: 0  Hypertriglyceridemia -     Gemfibrozil ; Take 1 tablet (600 mg total) by mouth 2 (two) times daily before a meal.  Dispense: 180 tablet; Refill: 0  Alcohol dependence, daily use (HCC)  Chronic cervical radiculopathy    Essential hypertension; hypertensive urgency Patient endorses no acute symptoms at this time and does not want to go to the ER.  He has been taking his losartan  as prescribed.  Will add on additional medications today as noted.  Patient did not get his blood work drawn at his last visit.  He will be getting it drawn today. - Continue losartan  100 mg daily - Started hydrochlorothiazide  25 mg daily - Start carvedilol 6.25 mg twice daily on Tuesday - Will consider referral to hypertensive clinic at next visit if his blood pressure remains severely elevated.  Mixed hyperlipidemia; hypertriglyceridemia Patient will be having his blood work drawn today.  Added on a lipoprotein a due to severe elevations of cholesterol in the past. - Start gemfibrozil  - Pending blood work results, may consider starting a statin or alternative additional antihyperlipidemic.  Alcohol dependence, daily use Counseled patient on alcohol cessation.  Advised tapering use.  Patient states he is willing to commit to cutting his alcohol use in half of an order to start this process.  Cautioned patient against abrupt cessation due to risk of severe withdrawal up to and including seizures.  Chronic cervical radiculopathy Follows with [facility] - Dr. Adrien Alberta - Lyrica  and baclofen, as well as back and neck injections    Return in about 2 weeks (around 12/26/2023).      I discussed the assessment and treatment plan with the patient  The patient was provided an opportunity to ask questions and all were answered. The patient agreed with the plan and demonstrated an understanding of the instructions.   The patient was advised to call back  or seek an in-person evaluation if the symptoms worsen or if the condition fails to improve as anticipated.    Carlean Charter, DO  [facility] [phone] (phone) [phone] (fax)  [facility]

## 2023-12-13 LAB — LIPID PANEL
Chol/HDL Ratio: 4.8 ratio (ref 0.0–5.0)
Cholesterol, Total: 308 mg/dL — ABNORMAL HIGH (ref 100–199)
HDL: 64 mg/dL (ref >39)
LDL Chol Calc (NIH): 200 mg/dL — ABNORMAL HIGH (ref 0–99)
Triglycerides: 229 mg/dL — ABNORMAL HIGH (ref 0–149)
VLDL Cholesterol Cal: 44 mg/dL — ABNORMAL HIGH (ref 5–40)

## 2023-12-13 LAB — COMPREHENSIVE METABOLIC PANEL WITH GFR
ALT: 64 IU/L — ABNORMAL HIGH (ref 0–44)
AST: 49 IU/L — ABNORMAL HIGH (ref 0–40)
Albumin: 4.3 g/dL (ref 3.8–4.9)
Alkaline Phosphatase: 71 IU/L (ref 44–121)
BUN/Creatinine Ratio: 17 (ref 9–20)
BUN: 20 mg/dL (ref 6–24)
Bilirubin Total: 0.3 mg/dL (ref 0.0–1.2)
CO2: 20 mmol/L (ref 20–29)
Calcium: 9 mg/dL (ref 8.7–10.2)
Chloride: 104 mmol/L (ref 96–106)
Creatinine, Ser: 1.18 mg/dL (ref 0.76–1.27)
Globulin, Total: 2.4 g/dL (ref 1.5–4.5)
Glucose: 77 mg/dL (ref 70–99)
Potassium: 3.9 mmol/L (ref 3.5–5.2)
Sodium: 142 mmol/L (ref 134–144)
Total Protein: 6.7 g/dL (ref 6.0–8.5)
eGFR: 75 mL/min/{1.73_m2} (ref >59)

## 2023-12-13 LAB — LIPOPROTEIN A (LPA): Lipoprotein (a): <8.4 nmol/L (ref <75.0)

## 2023-12-22 ENCOUNTER — Encounter: Payer: Self-pay | Admitting: Family Medicine

## 2023-12-24 ENCOUNTER — Encounter: Payer: Self-pay | Admitting: *Deleted

## 2023-12-26 ENCOUNTER — Ambulatory Visit: Admitting: Family Medicine

## 2023-12-26 ENCOUNTER — Encounter: Payer: Self-pay | Admitting: Family Medicine

## 2023-12-26 VITALS — BP 168/110 | HR 65 | Ht 72.0 in | Wt 223.0 lb

## 2023-12-26 DIAGNOSIS — E782 Mixed hyperlipidemia: Secondary | ICD-10-CM | POA: Diagnosis not present

## 2023-12-26 DIAGNOSIS — R748 Abnormal levels of other serum enzymes: Secondary | ICD-10-CM | POA: Insufficient documentation

## 2023-12-26 DIAGNOSIS — I10 Essential (primary) hypertension: Secondary | ICD-10-CM | POA: Diagnosis not present

## 2023-12-26 DIAGNOSIS — Z8719 Personal history of other diseases of the digestive system: Secondary | ICD-10-CM | POA: Diagnosis not present

## 2023-12-26 MED ORDER — CARVEDILOL 12.5 MG PO TABS: 12.5000 mg | ORAL_TABLET | Freq: Two times a day (BID) | ORAL | 3 refills | Status: DC

## 2023-12-26 MED ORDER — ROSUVASTATIN CALCIUM 10 MG PO TABS: 10.0000 mg | ORAL_TABLET | Freq: Every day | ORAL | 3 refills | Status: AC

## 2023-12-26 NOTE — Progress Notes (Signed)
 Established patient visit   Patient: Javier Griffin   DOB: 1971-03-24   53 y.o. Male  MRN: [id number] Visit Date: 12/26/2023  Today's healthcare provider: Carlean Charter, DO   Chief Complaint  Patient presents with   Hypertension    Discuss lab results    Subjective    Hypertension Pertinent negatives include no chest pain, headaches, palpitations or shortness of breath.   Javier Griffin is a 53 year old male with hypertension and elevated liver enzymes who presents for blood pressure management and medication review.  His blood pressure readings have improved since starting carvedilol , with averages around 154/83 mmHg. Blood pressure is higher in the mornings, with systolic readings ranging from 150 to 157 and diastolic readings from 79 to 105. He is currently taking 100 mg losartan , 25 mg hydrochlorothiazide , and recently started carvedilol . He initially felt a bit off after starting carvedilol  but is now feeling fine. He no longer wakes up with his heart pounding, which he describes as feeling like 'you've been running a marathon' and experienced prior to starting carvedilol .  He has mildly elevated liver enzymes and has been on gemfibrozil  for cholesterol management. He has not been on a statin before. His triglycerides were previously very high, which he associates with a past episode of pancreatitis. He has been trying to eat healthier and notes that his triglycerides are not as high as they once were. He sometimes forgets to take gemfibrozil  at night but takes it in the morning with breakfast.  No swelling in his legs but mentions tweaking his knee during a walk. He is able to perform activities like yard work without issues. He is actively involved in family events, such as his daughter's graduation and attending ball games, indicating a busy and active lifestyle.      Medications: Outpatient Medications Prior to Visit  Medication Sig Note   baclofen (LIORESAL) 10 MG  tablet Take 5-10 mg by mouth 3 (three) times daily as needed.    fluticasone  (FLONASE ) 50 MCG/ACT nasal spray Place 2 sprays into both nostrils daily.    hydrochlorothiazide  (HYDRODIURIL ) 25 MG tablet Take 1 tablet (25 mg total) by mouth daily.    losartan  (COZAAR ) 100 MG tablet Take 1 tablet (100 mg total) by mouth daily.    pregabalin  (LYRICA ) 150 MG capsule Take 150 mg by mouth every 12 (twelve) hours.    [DISCONTINUED] carvedilol  (COREG ) 6.25 MG tablet Take 1 tablet (6.25 mg total) by mouth 2 (two) times daily with a meal. Start 3 days after hydrochlorothiazide     [DISCONTINUED] gemfibrozil  (LOPID ) 600 MG tablet Take 1 tablet (600 mg total) by mouth 2 (two) times daily before a meal. 12/26/2023: starting statin   No facility-administered medications prior to visit.    Review of Systems  Respiratory: Negative.  Negative for cough, shortness of breath and wheezing.   Cardiovascular:  Negative for chest pain, palpitations and leg swelling.  Neurological:  Negative for weakness and headaches.        Objective    BP (!) 168/110   Pulse 65   Ht 6' (1.829 m)   Wt 223 lb (101.2 kg)   SpO2 100%   BMI 30.24 kg/m     Physical Exam Vitals and nursing note reviewed.  Constitutional:      General: He is not in acute distress.    Appearance: Normal appearance.  HENT:     Head: Normocephalic and atraumatic.  Eyes:  General: No scleral icterus.    Conjunctiva/sclera: Conjunctivae normal.  Cardiovascular:     Rate and Rhythm: Normal rate.  Pulmonary:     Effort: Pulmonary effort is normal.  Neurological:     Mental Status: He is alert and oriented to person, place, and time. Mental status is at baseline.  Psychiatric:        Mood and Affect: Mood normal.        Behavior: Behavior normal.      No results found for any visits on 12/26/23.  Assessment & Plan    Essential hypertension -     Carvedilol ; Take 1 tablet (12.5 mg total) by mouth 2 (two) times daily with a meal.   Dispense: 60 tablet; Refill: 3  Mixed hyperlipidemia -     Rosuvastatin Calcium; Take 1 tablet (10 mg total) by mouth daily.  Dispense: 90 tablet; Refill: 3  Elevated liver enzymes  History of pancreatitis     Essential hypertension Blood pressure improved with losartan , hydrochlorothiazide , and carvedilol . Morning readings remain elevated.  Discussed increasing carvedilol  versus starting a diuretic, likely 6, versus starting hydralazine with its higher frequency dosing. Discussed side effects and heart rate monitoring.  Will increase carvedilol  for better control. - Increase carvedilol  dose and monitor for bradycardia symptoms. - Schedule follow-up in 4 weeks to reassess blood pressure control.  Contact clinic sooner if experiencing any concerning symptoms.  Hyperlipidemia Initiated rosuvastatin due to improved triglycerides. Discontinued gemfibrozil  to avoid risk of rhabdomyolysis/myopathy with rosuvastatin. Discussed rosuvastatin benefits. - Discontinue gemfibrozil . - Send rosuvastatin prescription to pharmacy.  Elevated liver enzymes Slightly elevated liver enzymes. Plan to monitor with future blood work, especially with rosuvastatin initiation. - Repeat blood work at next visit to monitor liver function.  History of pancreatitis Improved triglyceride levels reduce pancreatitis recurrence risk.  General Health Maintenance Discussed immunizations for pneumonia and shingles. - Administer pneumonia vaccine at next visit. - Advise him to receive shingles vaccine at pharmacy.    Return in about 4 weeks (around 01/23/2024) for HTN, CPE.      I discussed the assessment and treatment plan with the patient  The patient was provided an opportunity to ask questions and all were answered. The patient agreed with the plan and demonstrated an understanding of the instructions.   The patient was advised to call back or seek an in-person evaluation if the symptoms worsen or if the  condition fails to improve as anticipated.    Carlean Charter, DO  [facility] [phone] (phone) [phone] (fax)  [facility]

## 2023-12-27 ENCOUNTER — Other Ambulatory Visit: Payer: Self-pay | Admitting: Family Medicine

## 2023-12-27 DIAGNOSIS — I10 Essential (primary) hypertension: Secondary | ICD-10-CM

## 2023-12-30 NOTE — Telephone Encounter (Signed)
 Requested Prescriptions  Pending Prescriptions Disp Refills   losartan  (COZAAR ) 100 MG tablet [Pharmacy Med Name: LOSARTAN  POTASSIUM 100 MG TAB] 90 tablet 0    Sig: TAKE 1 TABLET BY MOUTH EVERY DAY     Cardiovascular:  Angiotensin Receptor Blockers Failed - 12/30/2023  8:34 AM      Failed - Last BP in normal range    BP Readings from Last 1 Encounters:  12/26/23 (!) 168/110         Failed - Valid encounter within last 6 months    Recent Outpatient Visits           4 days ago Essential hypertension   [facility] [place], Asencion Blacksmith, DO   2 weeks ago Mixed hyperlipidemia   [facility] [place], Asencion Blacksmith, DO   3 weeks ago Asymptomatic hypertensive urgency   [facility] [place], Asencion Blacksmith, DO       Future Appointments             In 1 month [place], Asencion Blacksmith, DO [facility]            Passed - Cr in normal range and within 180 days    Creatinine, Ser  Date Value Ref Range Status  12/12/2023 1.18 0.76 - 1.27 mg/dL Final         Passed - K in normal range and within 180 days    Potassium  Date Value Ref Range Status  12/12/2023 3.9 3.5 - 5.2 mmol/L Final         Passed - Patient is not pregnant

## 2024-02-02 ENCOUNTER — Encounter: Admitting: Family Medicine

## 2024-03-03 ENCOUNTER — Ambulatory Visit (INDEPENDENT_AMBULATORY_CARE_PROVIDER_SITE_OTHER): Admitting: Family Medicine

## 2024-03-03 ENCOUNTER — Encounter: Payer: Self-pay | Admitting: Family Medicine

## 2024-03-03 VITALS — BP 160/109 | HR 66 | Ht 72.0 in | Wt 217.1 lb

## 2024-03-03 DIAGNOSIS — F1020 Alcohol dependence, uncomplicated: Secondary | ICD-10-CM

## 2024-03-03 DIAGNOSIS — Z0000 Encounter for general adult medical examination without abnormal findings: Secondary | ICD-10-CM

## 2024-03-03 DIAGNOSIS — Z0001 Encounter for general adult medical examination with abnormal findings: Secondary | ICD-10-CM

## 2024-03-03 DIAGNOSIS — E782 Mixed hyperlipidemia: Secondary | ICD-10-CM | POA: Diagnosis not present

## 2024-03-03 DIAGNOSIS — I10 Essential (primary) hypertension: Secondary | ICD-10-CM

## 2024-03-03 MED ORDER — LOSARTAN POTASSIUM 100 MG PO TABS: 100.0000 mg | ORAL_TABLET | Freq: Every day | ORAL | 2 refills | Status: AC

## 2024-03-03 MED ORDER — HYDROCHLOROTHIAZIDE 25 MG PO TABS: 25.0000 mg | ORAL_TABLET | Freq: Every day | ORAL | 2 refills | Status: AC

## 2024-03-03 NOTE — Patient Instructions (Addendum)
 Recommended vaccines: shingles (shingrix), hepatitis B (heplisav), pneumonia (prevnar-20 or -21)  Contact [facility] in [place] to schedule your colonoscopy. [address], [place], [place] [address] [phone]   Please bring your blood pressure cuff to your next visit!

## 2024-03-03 NOTE — Progress Notes (Signed)
 Complete physical exam   Patient: Javier Griffin   DOB: 1971-03-18   53 y.o. Male  MRN: [id number] Visit Date: 03/03/2024  Today's healthcare provider: LAURAINE LOISE BUOY, DO   Chief Complaint  Patient presents with   Annual Exam    Sleeping pattern: Good Exercise: Daily walks, yard work No concerns   Hypertension   Subjective    Javier Griffin is a 53 y.o. male who presents today for a complete physical exam.  He reports consuming a general diet. He is very physically active at home and with helping with his neighbor's yard. He generally feels well. He reports sleeping well. He does not have additional problems to discuss today.   Hypertension Pertinent negatives include no chest pain, headaches (previously reported pounding has fully improved), palpitations or shortness of breath.   HPI     Annual Exam    Additional comments: Sleeping pattern: Good Exercise: Daily walks, yard work No concerns      Last edited by Wilfred Hargis RAMAN, CMA on 03/03/2024 11:04 AM.       Javier Griffin is a 53 year old male with hypertension who presents for an annual physical exam.  He monitors his blood pressure at home, with systolic readings ranging from 128 to 144 mmHg and diastolic readings as low as 73 mmHg. There is variability depending on the time of day, with higher readings in the morning. He has a history of blood pressure readings in the 200s (during his visit to establish care), which have improved. He is currently taking carvedilol  twice daily, losartan , and rosuvastatin . He experiences occasional lightheadedness, particularly when standing up quickly. No chest pain, shortness of breath, or headaches.  He has not had a colonoscopy and recalls a previous referral for one, but it was not completed due to high blood pressure at the time.  He was referred at the last visit but has not yet scheduled his appointment.    He has not received the hepatitis B vaccine and is unsure of his  vaccination history. He has not had a pneumonia vaccine but had an appointment scheduled that was canceled.  He drinks approximately eight beers a day, reduced from twelve, and acknowledges increased consumption during a recent beach trip. He has Medicare and is awaiting additional insurance coverage through the [employer].  He has three children, with the youngest preparing to attend college. He mentions a neighbor undergoing chemotherapy and assisting him with yard work, which led to lightheadedness after planting numerous tomato plants.     Past Medical History:  Diagnosis Date   Arthritis    Bronchitis    GERD (gastroesophageal reflux disease)    Headache    due to neck pain   Hypertension    treated at one time, no medications at this time   Neck injury    Past Surgical History:  Procedure Laterality Date   ANTERIOR CERVICAL DECOMP/DISCECTOMY FUSION N/A 04/22/2013   Procedure: ANTERIOR CERVICAL DECOMPRESSION/DISCECTOMY FUSION 1 CERVICAL SEVEN-THORACIC ONELEVEL/HARDWARE REMOVAL;  Surgeon: Reyes JONETTA Budge, MD;  Location: [facility];  Service: Neurosurgery;  Laterality: N/A;  C7/T1 anterior cervical decompression with fusion interbody prothesis plating and bonegraft with removal of codman slim loc plate   ANTERIOR CERVICAL DECOMP/DISCECTOMY FUSION N/A 07/04/2016   Procedure: ANTERIOR CERVICAL DECOMPRESSION/DISCECTOMY FUSION, INTERBODY PROSTHESIS,PLATE Cervical three-four;  Surgeon: Reyes Budge, MD;  Location: [facility] OR;  Service: Neurosurgery;  Laterality: N/A;   carpel tunnel Left    and elbow  CERVICAL SPINE SURGERY     x3   ELBOW SURGERY Left    nerve release   POSTERIOR CERVICAL FUSION/FORAMINOTOMY N/A 05/26/2014   Procedure: Cervical four-five, Cervical seven-Thoracic one Posterior Cervical Instrumentation and Fusion;  Surgeon: Reyes Budge, MD;  Location: [facility];  Service: Neurosurgery;  Laterality: N/A;   POSTERIOR CERVICAL LAMINECTOMY N/A 02/08/2015   Procedure:  Removal of Thoracic one Pedicle Screw;  Surgeon: Reyes Budge, MD;  Location: [facility];  Service: Neurosurgery;  Laterality: N/A;  removal of T1 pedicle screw   WISDOM TOOTH EXTRACTION     Social History   Socioeconomic History   Marital status: Married    Spouse name: Not on file   Number of children: Not on file   Years of education: Not on file   Highest education level: Not on file  Occupational History   Not on file  Tobacco Use   Smoking status: Never   Smokeless tobacco: Former    Types: Snuff    Quit date: 05/24/1992  Substance and Sexual Activity   Alcohol use: Yes    Alcohol/week: 8.0 standard drinks of alcohol    Types: 8 Cans of beer per week   Drug use: No   Sexual activity: Not Currently  Other Topics Concern   Not on file  Social History Narrative   Not on file   Social Drivers of Health   Financial Resource Strain: Low Risk  (12/05/2023)   Overall Financial Resource Strain (CARDIA)    Difficulty of Paying Living Expenses: Not hard at all  Food Insecurity: No Food Insecurity (12/05/2023)   Hunger Vital Sign    Worried About Running Out of Food in the Last Year: Never true    Ran Out of Food in the Last Year: Never true  Transportation Needs: No Transportation Needs (12/05/2023)   PRAPARE - Administrator, Civil Service (Medical): No    Lack of Transportation (Non-Medical): No  Physical Activity: Not on file  Stress: No Stress Concern Present (12/05/2023)   [employer] of Occupational Health - Occupational Stress Questionnaire    Feeling of Stress : Only a little  Social Connections: Not on file  Intimate Partner Violence: Not At Risk (12/05/2023)   Humiliation, Afraid, Rape, and Kick questionnaire    Fear of Current or Ex-Partner: No    Emotionally Abused: No    Physically Abused: No    Sexually Abused: No   Family Status  Relation Name Status   Mother  Deceased   Father  Alive   MGM  Deceased   MGF  Deceased   PGF  Deceased   No partnership data on file   Family History  Problem Relation Age of Onset   Lung cancer Mother    Hypertension Father    Emphysema Maternal Grandfather    Heart disease Paternal Grandfather    Allergies  Allergen Reactions   Amlodipine      Leg swelling   Lisinopril  Cough    Patient Care Team: Ayush Boulet N, DO as PCP - General (Family Medicine)   Medications: Outpatient Medications Prior to Visit  Medication Sig   baclofen (LIORESAL) 10 MG tablet Take 5-10 mg by mouth 3 (three) times daily as needed.   carvedilol  (COREG ) 12.5 MG tablet Take 1 tablet (12.5 mg total) by mouth 2 (two) times daily with a meal.   fluticasone  (FLONASE ) 50 MCG/ACT nasal spray Place 2 sprays into both nostrils daily.   pregabalin  (LYRICA ) 150  MG capsule Take 150 mg by mouth every 12 (twelve) hours.   rosuvastatin  (CRESTOR ) 10 MG tablet Take 1 tablet (10 mg total) by mouth daily.   [DISCONTINUED] hydrochlorothiazide  (HYDRODIURIL ) 25 MG tablet Take 1 tablet (25 mg total) by mouth daily.   [DISCONTINUED] losartan  (COZAAR ) 100 MG tablet TAKE 1 TABLET BY MOUTH EVERY DAY   No facility-administered medications prior to visit.    Review of Systems  Constitutional:  Negative for appetite change, chills, fatigue and fever.  HENT:  Negative for congestion, ear pain, hearing loss, nosebleeds and trouble swallowing.   Eyes:  Negative for pain and visual disturbance.  Respiratory:  Negative for cough, chest tightness and shortness of breath.   Cardiovascular:  Negative for chest pain, palpitations and leg swelling.  Gastrointestinal:  Negative for abdominal pain, blood in stool, constipation, diarrhea, nausea and vomiting.  Endocrine: Negative for polydipsia, polyphagia and polyuria.  Genitourinary:  Negative for dysuria and flank pain.  Musculoskeletal:  Negative for arthralgias, back pain, joint swelling, myalgias and neck stiffness.  Skin:  Negative for color change, rash and wound.  Neurological:   Positive for light-headedness (intermittently). Negative for dizziness, tremors, seizures, speech difficulty, weakness and headaches (previously reported pounding has fully improved).  Psychiatric/Behavioral:  Negative for behavioral problems, confusion, dysphoric mood and sleep disturbance. The patient is not nervous/anxious.   All other systems reviewed and are negative.     Objective    BP (!) 160/109 (BP Location: Left Arm, Patient Position: Sitting, Cuff Size: Large)   Pulse 66   Ht 6' (1.829 m)   Wt 217 lb 1.6 oz (98.5 kg)   SpO2 98%   BMI 29.44 kg/m    Physical Exam Vitals and nursing note reviewed.  Constitutional:      General: He is awake.     Appearance: Normal appearance.  HENT:     Head: Normocephalic and atraumatic.     Right Ear: Tympanic membrane, ear canal and external ear normal.     Left Ear: Tympanic membrane, ear canal and external ear normal.     Nose: Nose normal.     Mouth/Throat:     Mouth: Mucous membranes are moist.     Pharynx: Oropharynx is clear. No oropharyngeal exudate or posterior oropharyngeal erythema.  Eyes:     General: No scleral icterus.    Extraocular Movements: Extraocular movements intact.     Conjunctiva/sclera: Conjunctivae normal.     Pupils: Pupils are equal, round, and reactive to light.  Neck:     Thyroid: No thyromegaly or thyroid tenderness.  Cardiovascular:     Rate and Rhythm: Normal rate and regular rhythm.     Pulses: Normal pulses.     Heart sounds: Normal heart sounds.  Pulmonary:     Effort: Pulmonary effort is normal. No tachypnea, bradypnea or respiratory distress.     Breath sounds: Normal breath sounds. No stridor. No wheezing, rhonchi or rales.  Abdominal:     General: Bowel sounds are normal. There is no distension.     Palpations: Abdomen is soft. There is no mass.     Tenderness: There is no abdominal tenderness. There is no guarding.     Hernia: No hernia is present.  Musculoskeletal:     Cervical  back: Normal range of motion and neck supple.     Right lower leg: No edema.     Left lower leg: No edema.  Lymphadenopathy:     Cervical: No cervical adenopathy.  Skin:  General: Skin is warm and dry.  Neurological:     Mental Status: He is alert and oriented to person, place, and time. Mental status is at baseline.  Psychiatric:        Mood and Affect: Mood normal.        Behavior: Behavior normal.      Last depression screening scores    03/03/2024   11:06 AM 12/12/2023    8:19 AM 12/05/2023    8:42 AM  PHQ 2/9 Scores  PHQ - 2 Score 0 1 0  PHQ- 9 Score 0 2 3   Last fall risk screening    03/03/2024   11:06 AM  Fall Risk   Falls in the past year? 0  Number falls in past yr: 0  Injury with Fall? 0  Risk for fall due to : No Fall Risks   Last Audit-C alcohol use screening    12/12/2023    8:40 AM  Alcohol Use Disorder Test (AUDIT)  1. How often do you have a drink containing alcohol? 4  2. How many drinks containing alcohol do you have on a typical day when you are drinking? 3  3. How often do you have six or more drinks on one occasion? 4  AUDIT-C Score 11  4. How often during the last year have you found that you were not able to stop drinking once you had started? 0  5. How often during the last year have you failed to do what was normally expected from you because of drinking? 0  6. How often during the last year have you needed a first drink in the morning to get yourself going after a heavy drinking session? 0  7. How often during the last year have you had a feeling of guilt of remorse after drinking? 0  8. How often during the last year have you been unable to remember what happened the night before because you had been drinking? 1  9. Have you or someone else been injured as a result of your drinking? 0  10. Has a relative or friend or a doctor or another health worker been concerned about your drinking or suggested you cut down? 4  Alcohol Use Disorder  Identification Test Final Score (AUDIT) 16   A score of 3 or more in women, and 4 or more in men indicates increased risk for alcohol abuse, EXCEPT if all of the points are from question 1   No results found for any visits on 03/03/24.  Assessment & Plan    Routine Health Maintenance and Physical Exam  Exercise Activities and Dietary recommendations  Goals      Reduce alcohol intake     Continue slow reduction in alcohol use to facilitate adherence and reduce likelihood of withdrawal.          Immunization History  Administered Date(s) Administered   Influenza,inj,Quad PF,6+ Mos 05/28/2014, 05/29/2015, 05/27/2017   Tdap 05/27/2017    Health Maintenance  Topic Date Due   Medicare Annual Wellness (AWV)  Never done   Pneumococcal Vaccine 54-36 Years old (1 of 2 - PCV) Never done   Hepatitis B Vaccines (1 of 3 - 19+ 3-dose series) Never done   Colonoscopy  Never done   Zoster Vaccines- Shingrix (1 of 2) Never done   COVID-19 Vaccine (1 - 2024-25 season) 05/19/2024 (Originally 04/20/2023)   INFLUENZA VACCINE  03/19/2024   DTaP/Tdap/Td (2 - Td or Tdap) 05/28/2027   HPV VACCINES  Aged Out   Meningococcal B Vaccine  Aged Out   Hepatitis C Screening  Discontinued   HIV Screening  Discontinued    Discussed health benefits of physical activity, and encouraged him to engage in regular exercise appropriate for his age and condition.   Annual physical exam  Essential hypertension -     Losartan  Potassium; Take 1 tablet (100 mg total) by mouth daily.  Dispense: 90 tablet; Refill: 2 -     hydroCHLOROthiazide ; Take 1 tablet (25 mg total) by mouth daily.  Dispense: 90 tablet; Refill: 2  Mixed hyperlipidemia  Alcohol dependence, daily use (HCC)      Annual physical exam Physical exam overall unremarkable except as noted above. Routine lab work ordered as noted.  Recommended colonoscopy, hepatitis B, pneumonia, and shingles vaccinations. Advised to reduce alcohol consumption from  8 drinks per day. Explained hepatitis B vaccine series importance. - Schedule colonoscopy. - Recommend hepatitis B vaccination. - Recommend pneumonia vaccination. - Recommend shingles vaccination.  Essential hypertension Blood pressure significantly improved from previous readings in the 200s, though still elevated in clinic today.  Home blood pressures largely within expected limits (Home BPs: 120s-140s/70s-80s).  Lightheadedness likely due to medication effects and his body having been used to his much higher blood pressure. - No medication changes today to allow his body to continue to equilibrate at his current lower pressure. - Refill hydrochlorothiazide  and losartan  prescriptions. - Continue carvedilol  12.5 mg twice daily.  Refill with 90-day supply when due. - Continue home blood pressure monitoring. - Bring blood pressure cuff to next visit for accuracy comparison.  Mixed hyperlipidemia Total cholesterol 308, LDL 200 and triglycerides 770.  On rosuvastatin  10 mg daily. Blood work deferred to next visit per patient preference and short duration of therapy. - Continue rosuvastatin  10 mg daily. - Plan to repeat blood work at next visit.  Plan to include testing for apolipoprotein A1, apolipoprotein B (including ratio) and apolipoprotein E variants.  Alcohol dependence, daily use Patient reports significant reduction in alcohol use, down to 8 beers per day from 12 beers per day.  Congratulated him on this reduction and encouraged him to continue with this slow process.  Discussed that a slow reduction helps to mitigate likelihood of withdrawal, which patient expressed understanding and awareness of.    Follow-up Follow-up planned to reassess blood pressure control, medication effects, and conduct blood work including testosterone levels due to fatigue. - Schedule follow-up in 4 months. - Include testosterone level, apolipoprotein A1, apolipoprotein B (including ratio) and  apolipoprotein E variants in next blood work.  Return in about 4 months (around 07/04/2024) for Chronic f/u, HTN, and mAWV with AWV nurse.     I discussed the assessment and treatment plan with the patient  The patient was provided an opportunity to ask questions and all were answered. The patient agreed with the plan and demonstrated an understanding of the instructions.   The patient was advised to call back or seek an in-person evaluation if the symptoms worsen or if the condition fails to improve as anticipated.    LAURAINE LOISE BUOY, DO  [facility] [phone] (phone) [phone] (fax)  [facility]

## 2024-03-11 ENCOUNTER — Encounter: Payer: Self-pay | Admitting: Family Medicine

## 2024-03-20 ENCOUNTER — Other Ambulatory Visit: Payer: Self-pay | Admitting: Family Medicine

## 2024-03-20 DIAGNOSIS — I10 Essential (primary) hypertension: Secondary | ICD-10-CM

## 2024-03-29 ENCOUNTER — Other Ambulatory Visit: Payer: Self-pay | Admitting: Family Medicine

## 2024-03-29 DIAGNOSIS — J301 Allergic rhinitis due to pollen: Secondary | ICD-10-CM

## 2024-04-27 ENCOUNTER — Other Ambulatory Visit: Payer: Self-pay | Admitting: Family Medicine

## 2024-04-27 DIAGNOSIS — I10 Essential (primary) hypertension: Secondary | ICD-10-CM

## 2024-06-04 ENCOUNTER — Ambulatory Visit: Admitting: Family Medicine

## 2024-06-08 ENCOUNTER — Encounter: Payer: Self-pay | Admitting: Family Medicine

## 2024-06-08 ENCOUNTER — Ambulatory Visit: Admitting: Family Medicine

## 2024-06-08 VITALS — BP 155/103 | HR 65 | Temp 98.9°F | Ht 72.0 in | Wt 213.5 lb

## 2024-06-08 DIAGNOSIS — Z0001 Encounter for general adult medical examination with abnormal findings: Secondary | ICD-10-CM

## 2024-06-08 DIAGNOSIS — F1020 Alcohol dependence, uncomplicated: Secondary | ICD-10-CM | POA: Diagnosis not present

## 2024-06-08 DIAGNOSIS — M542 Cervicalgia: Secondary | ICD-10-CM

## 2024-06-08 DIAGNOSIS — R5382 Chronic fatigue, unspecified: Secondary | ICD-10-CM

## 2024-06-08 DIAGNOSIS — E782 Mixed hyperlipidemia: Secondary | ICD-10-CM | POA: Diagnosis not present

## 2024-06-08 DIAGNOSIS — Z9189 Other specified personal risk factors, not elsewhere classified: Secondary | ICD-10-CM

## 2024-06-08 DIAGNOSIS — Z23 Encounter for immunization: Secondary | ICD-10-CM

## 2024-06-08 DIAGNOSIS — I10 Essential (primary) hypertension: Secondary | ICD-10-CM

## 2024-06-08 DIAGNOSIS — Z0000 Encounter for general adult medical examination without abnormal findings: Secondary | ICD-10-CM

## 2024-06-08 DIAGNOSIS — Z1211 Encounter for screening for malignant neoplasm of colon: Secondary | ICD-10-CM

## 2024-06-08 MED ORDER — CARVEDILOL 25 MG PO TABS: 25.0000 mg | ORAL_TABLET | Freq: Two times a day (BID) | ORAL | 3 refills | Status: DC

## 2024-06-08 NOTE — Assessment & Plan Note (Signed)
 Noted.  Counseled patient on recommended maximum of 2 standard alcoholic beverages per day and long-term risk for liver damage.  Continue to monitor.

## 2024-06-08 NOTE — Assessment & Plan Note (Signed)
 Noted.  No acute concerns.  Follows with pain management; defer to specialist management.

## 2024-06-08 NOTE — Progress Notes (Signed)
 Subjective:   Javier Griffin is a 53 y.o. male who presents for Medicare Annual/Subsequent preventive examination.  Visit Complete: In person        Objective:    Today's Vitals   06/08/24 0822  BP: (!) 155/103  Pulse: 65  Temp: 98.9 F (37.2 C)  TempSrc: Oral  SpO2: 99%  Weight: 213 lb 8 oz (96.8 kg)  Height: 6' (1.829 m)   Body mass index is 28.96 kg/m.     06/26/2016   10:33 AM 12/16/2015    2:42 PM 06/16/2015   12:26 PM 04/26/2015    8:30 AM 02/09/2015    6:00 AM 02/07/2015    9:09 AM 05/26/2014   11:00 PM  Advanced Directives  Does Patient Have a Medical Advance Directive? No  No  No  No  No  No  No   Would patient like information on creating a medical advance directive? No - patient declined information  No - patient declined information  No - patient declined information   No - patient declined information   No - patient declined information      Data saved with a previous flowsheet row definition    Current Medications (verified) Outpatient Encounter Medications as of 06/08/2024  Medication Sig   baclofen (LIORESAL) 10 MG tablet Take 5-10 mg by mouth 3 (three) times daily as needed.   carvedilol  (COREG ) 25 MG tablet Take 1 tablet (25 mg total) by mouth 2 (two) times daily with a meal.   fluticasone  (FLONASE ) 50 MCG/ACT nasal spray SPRAY 2 SPRAYS INTO EACH NOSTRIL EVERY DAY   hydrochlorothiazide  (HYDRODIURIL ) 25 MG tablet Take 1 tablet (25 mg total) by mouth daily.   losartan  (COZAAR ) 100 MG tablet Take 1 tablet (100 mg total) by mouth daily.   pregabalin  (LYRICA ) 150 MG capsule Take 150 mg by mouth every 12 (twelve) hours.   rosuvastatin  (CRESTOR ) 10 MG tablet Take 1 tablet (10 mg total) by mouth daily.   [DISCONTINUED] carvedilol  (COREG ) 12.5 MG tablet TAKE 1 TABLET (12.5MG  TOTAL) BY MOUTH TWICE A DAY WITH MEALS   No facility-administered encounter medications on file as of 06/08/2024.    Allergies (verified) Amlodipine  and Lisinopril    History: Past  Medical History:  Diagnosis Date   Arthritis    Bronchitis    GERD (gastroesophageal reflux disease)    Headache    due to neck pain   Hypertension    treated at one time, no medications at this time   Neck injury    Past Surgical History:  Procedure Laterality Date   ANTERIOR CERVICAL DECOMP/DISCECTOMY FUSION N/A 04/22/2013   Procedure: ANTERIOR CERVICAL DECOMPRESSION/DISCECTOMY FUSION 1 CERVICAL SEVEN-THORACIC ONELEVEL/HARDWARE REMOVAL;  Surgeon: Reyes JONETTA Budge, MD;  Location: [facility];  Service: Neurosurgery;  Laterality: N/A;  C7/T1 anterior cervical decompression with fusion interbody prothesis plating and bonegraft with removal of codman slim loc plate   ANTERIOR CERVICAL DECOMP/DISCECTOMY FUSION N/A 07/04/2016   Procedure: ANTERIOR CERVICAL DECOMPRESSION/DISCECTOMY FUSION, INTERBODY PROSTHESIS,PLATE Cervical three-four;  Surgeon: Reyes Budge, MD;  Location: [facility] OR;  Service: Neurosurgery;  Laterality: N/A;   carpel tunnel Left    and elbow    CERVICAL SPINE SURGERY     x3   ELBOW SURGERY Left    nerve release   POSTERIOR CERVICAL FUSION/FORAMINOTOMY N/A 05/26/2014   Procedure: Cervical four-five, Cervical seven-Thoracic one Posterior Cervical Instrumentation and Fusion;  Surgeon: Reyes Budge, MD;  Location: [facility];  Service: Neurosurgery;  Laterality: N/A;  POSTERIOR CERVICAL LAMINECTOMY N/A 02/08/2015   Procedure: Removal of Thoracic one Pedicle Screw;  Surgeon: Reyes Budge, MD;  Location: [facility];  Service: Neurosurgery;  Laterality: N/A;  removal of T1 pedicle screw   WISDOM TOOTH EXTRACTION     Family History  Problem Relation Age of Onset   Lung cancer Mother    Hypertension Father    Emphysema Maternal Grandfather    Heart disease Paternal Grandfather    Social History   Socioeconomic History   Marital status: Married    Spouse name: Not on file   Number of children: Not on file   Years of education: Not on file   Highest education  level: Not on file  Occupational History   Not on file  Tobacco Use   Smoking status: Never   Smokeless tobacco: Former    Types: Snuff    Quit date: 05/24/1992  Substance and Sexual Activity   Alcohol use: Yes    Alcohol/week: 8.0 standard drinks of alcohol    Types: 8 Cans of beer per week   Drug use: No   Sexual activity: Not Currently  Other Topics Concern   Not on file  Social History Narrative   Not on file   Social Drivers of Health   Financial Resource Strain: Low Risk  (12/05/2023)   Overall Financial Resource Strain (CARDIA)    Difficulty of Paying Living Expenses: Not hard at all  Food Insecurity: No Food Insecurity (12/05/2023)   Hunger Vital Sign    Worried About Running Out of Food in the Last Year: Never true    Ran Out of Food in the Last Year: Never true  Transportation Needs: No Transportation Needs (12/05/2023)   PRAPARE - Administrator, Civil Service (Medical): No    Lack of Transportation (Non-Medical): No  Physical Activity: Not on file  Stress: No Stress Concern Present (12/05/2023)   [employer] of Occupational Health - Occupational Stress Questionnaire    Feeling of Stress : Only a little  Social Connections: Not on file    Tobacco Counseling Counseling given: Not Answered   Clinical Intake:   Activities of Daily Living    06/08/2024    8:40 AM  In your present state of health, do you have any difficulty performing the following activities:  Hearing? 0  Vision? 0  Difficulty concentrating or making decisions? 0  Walking or climbing stairs? 0  Dressing or bathing? 0  Doing errands, shopping? 0    Patient Care Team: Donzella Lauraine SAILOR, DO as PCP - General (Family Medicine)  Indicate any recent Medical Services you may have received from other than [facility] providers in the past year (date may be approximate).     Assessment:   This is a routine wellness examination for Javier Griffin.  Hearing/Vision screen No results found.    Goals Addressed   None    Depression Screen    06/08/2024    8:22 AM 03/03/2024   11:06 AM 12/12/2023    8:19 AM 12/05/2023    8:42 AM 05/27/2017   10:24 AM 06/16/2015   12:29 PM 06/16/2015   12:28 PM  PHQ 2/9 Scores  PHQ - 2 Score 0 0 1 0 0 0 0  PHQ- 9 Score 0 0 2 3  0     Fall Risk    06/08/2024    8:22 AM 03/03/2024   11:06 AM 06/16/2015   12:28 PM  Fall Risk   Falls in  the past year? 0 0 No   Number falls in past yr: 0 0   Injury with Fall? 0 0   Risk for fall due to : No Fall Risks No Fall Risks      Data saved with a previous flowsheet row definition    MEDICARE RISK AT HOME:    TIMED UP AND GO:  Was the test performed?  No    Cognitive Function:        06/08/2024    8:40 AM  6CIT Screen  What Year? 0 points  What month? 0 points  What time? 0 points  Count back from 20 0 points  Months in reverse 0 points  Repeat phrase 2 points  Total Score 2 points    Immunizations Immunization History  Administered Date(s) Administered   Influenza, Seasonal, Injecte, Preservative Fre 05/27/2023   Influenza,inj,Quad PF,6+ Mos 05/28/2014, 05/29/2015, 05/27/2017, 06/04/2022   PNEUMOCOCCAL CONJUGATE-20 06/08/2024   Tdap 05/27/2017    TDAP status: Up to date  Flu Vaccine status: Up to date  Pneumococcal vaccine status: Completed during today's visit.  Covid-19 vaccine status: Declined, Education has been provided regarding the importance of this vaccine but patient still declined. Advised may receive this vaccine at local pharmacy or Health Dept.or vaccine clinic. Aware to provide a copy of the vaccination record if obtained from local pharmacy or Health Dept. Verbalized acceptance and understanding.  Qualifies for Shingles Vaccine? Yes   Zostavax completed No   Shingrix Completed?: No.    Education has been provided regarding the importance of this vaccine. Patient has been advised to call insurance company to determine out of pocket expense if they have not  yet received this vaccine. Advised may also receive vaccine at local pharmacy or Health Dept. Verbalized acceptance and understanding.  Screening Tests Health Maintenance  Topic Date Due   Colonoscopy  Never done   Zoster Vaccines- Shingrix (1 of 2) 09/08/2024 (Originally 09/26/2022)   Influenza Vaccine  11/16/2024 (Originally 03/19/2024)   COVID-19 Vaccine (1 - 2025-26 season) 04/19/2025 (Originally 04/19/2024)   Hepatitis B Vaccines 19-59 Average Risk (1 of 3 - 19+ 3-dose series) 06/08/2025 (Originally 09/27/1991)   Medicare Annual Wellness (AWV)  06/08/2025   DTaP/Tdap/Td (2 - Td or Tdap) 05/28/2027   Pneumococcal Vaccine: 50+ Years  Completed   HPV VACCINES  Aged Out   Meningococcal B Vaccine  Aged Out   Hepatitis C Screening  Discontinued   HIV Screening  Discontinued    Health Maintenance  Health Maintenance Due  Topic Date Due   Colonoscopy  Never done    Colorectal cancer screening: Cologuard ordered  Lung Cancer Screening: (Low Dose CT Chest recommended if Age 47-80 years, 20 pack-year currently smoking OR have quit w/in 15years.) does not qualify.   Lung Cancer Screening Referral: N/A  Additional Screening:  Hepatitis C Screening: does qualify; Completed N/A; patient declined (believes he was tested and negative before)  Vision Screening: Recommended annual ophthalmology exams for early detection of glaucoma and other disorders of the eye. Is the patient up to date with their annual eye exam?  No  Who is the provider or what is the name of the office in which the patient attends annual eye exams? [facility]  Dental Screening: Recommended annual dental exams for proper oral hygiene  Diabetic Foot Exam: N/A  Community Resource Referral / Chronic Care Management: CRR required this visit?  No   CCM required this visit?  No  Plan:     I have personally reviewed and noted the following in the patient's chart:   Medical and social history Use of alcohol,  tobacco or illicit drugs  Current medications and supplements including opioid prescriptions. Patient is not currently taking opioid prescriptions. Functional ability and status Nutritional status Physical activity Advanced directives List of other physicians Hospitalizations, surgeries, and ER visits in previous 12 months Vitals Screenings to include cognitive, depression, and falls Referrals and appointments  In addition, I have reviewed and discussed with patient certain preventive protocols, quality metrics, and best practice recommendations. A written personalized care plan for preventive services as well as general preventive health recommendations were provided to patient.     Malaney Mcbean N Anguel Delapena, DO   06/21/2024   After Visit Summary: (In Person-Declined) Patient declined AVS at this time.  ____________________________________________________________       Established patient visit   Patient: Javier Griffin   DOB: Jun 13, 1971   53 y.o. Male  MRN: [id number] Visit Date: 06/08/2024  Today's healthcare provider: LAURAINE LOISE BUOY, DO   Chief Complaint  Patient presents with   Medical Management of Chronic Issues    Colonoscopy not ready to order yet  Got flu shot last week other vaccines declined.    Hypertension    Patient is here for a follow up for hypertension and to get blood work done.  Monitors blood pressure at home and goes to pain clinic in [place] states that it has been good.  Patient does exercise walks.   Subjective    Hypertension Pertinent negatives include no chest pain, headaches, palpitations or shortness of breath.    CONNIE HILGERT is a 53 year old male who presents for a Medicare annual wellness visit.  He has a history of hypertension and is currently taking losartan , hydrochlorothiazide , and carvedilol . He monitors his blood pressure at home, reporting readings of 120s/70s, though he notes higher readings in clinical settings.   He has not  had a colon cancer screening despite a previous referral in April. He has not had a pneumonia vaccine or shingles vaccine, though his wife's friend recently had shingles. He has not received the hepatitis B vaccine.  He has a history of using chewing tobacco in the 1990s but denies current use or smoking. He has not had a recent eye exam but plans to start seeing an eye doctor now that he has insurance. He reports difficulty hearing.      Medications: Outpatient Medications Prior to Visit  Medication Sig   baclofen (LIORESAL) 10 MG tablet Take 5-10 mg by mouth 3 (three) times daily as needed.   fluticasone  (FLONASE ) 50 MCG/ACT nasal spray SPRAY 2 SPRAYS INTO EACH NOSTRIL EVERY DAY   hydrochlorothiazide  (HYDRODIURIL ) 25 MG tablet Take 1 tablet (25 mg total) by mouth daily.   losartan  (COZAAR ) 100 MG tablet Take 1 tablet (100 mg total) by mouth daily.   pregabalin  (LYRICA ) 150 MG capsule Take 150 mg by mouth every 12 (twelve) hours.   rosuvastatin  (CRESTOR ) 10 MG tablet Take 1 tablet (10 mg total) by mouth daily.   [DISCONTINUED] carvedilol  (COREG ) 12.5 MG tablet TAKE 1 TABLET (12.5MG  TOTAL) BY MOUTH TWICE A DAY WITH MEALS   No facility-administered medications prior to visit.    Review of Systems  Eyes:  Negative for visual disturbance.  Respiratory: Negative.  Negative for cough, shortness of breath and wheezing.   Cardiovascular:  Negative for chest pain, palpitations and leg swelling.  Neurological:  Positive  for numbness (unchanged from baseline r/t neck pain). Negative for dizziness, weakness, light-headedness and headaches.        Objective    BP (!) 155/103 (BP Location: Right Arm, Patient Position: Sitting, Cuff Size: Normal)   Pulse 65   Temp 98.9 F (37.2 C) (Oral)   Ht 6' (1.829 m)   Wt 213 lb 8 oz (96.8 kg)   SpO2 99%   BMI 28.96 kg/m     Physical Exam Vitals and nursing note reviewed.  Constitutional:      General: He is not in acute distress.     Appearance: Normal appearance.  HENT:     Head: Normocephalic and atraumatic.  Eyes:     General: No scleral icterus.    Conjunctiva/sclera: Conjunctivae normal.  Cardiovascular:     Rate and Rhythm: Normal rate.  Pulmonary:     Effort: Pulmonary effort is normal.  Neurological:     Mental Status: He is alert and oriented to person, place, and time. Mental status is at baseline.  Psychiatric:        Mood and Affect: Mood normal.        Behavior: Behavior normal.      Results for orders placed or performed in visit on 06/08/24  Microalbumin / creatinine urine ratio  Result Value Ref Range   Creatinine, Urine 55.3 Not Estab. mg/dL   Microalbumin, Urine 51.2 Not Estab. ug/mL   Microalb/Creat Ratio 88 (H) 0 - 29 mg/g creat  Comprehensive metabolic panel with GFR  Result Value Ref Range   Glucose 106 (H) 70 - 99 mg/dL   BUN 11 6 - 24 mg/dL   Creatinine, Ser 8.83 0.76 - 1.27 mg/dL   eGFR 76 >40 fO/fpw/8.26   BUN/Creatinine Ratio 9 9 - 20   Sodium 133 (L) 134 - 144 mmol/L   Potassium 3.8 3.5 - 5.2 mmol/L   Chloride 95 (L) 96 - 106 mmol/L   CO2 21 20 - 29 mmol/L   Calcium  9.1 8.7 - 10.2 mg/dL   Total Protein 6.9 6.0 - 8.5 g/dL   Albumin 4.0 3.8 - 4.9 g/dL   Globulin, Total 2.9 1.5 - 4.5 g/dL   Bilirubin Total 0.4 0.0 - 1.2 mg/dL   Alkaline Phosphatase 109 47 - 123 IU/L   AST 83 (H) 0 - 40 IU/L   ALT 60 (H) 0 - 44 IU/L  Lipid panel  Result Value Ref Range   Cholesterol, Total 406 (H) 100 - 199 mg/dL   Triglycerides 7,556 (HH) 0 - 149 mg/dL   HDL 20 (L) >60 mg/dL   VLDL Cholesterol Cal Comment (A) 5 - 40 mg/dL   LDL Chol Calc (NIH) Comment (A) 0 - 99 mg/dL   LDL CALC COMMENT: Comment    Chol/HDL Ratio 20.3 (H) 0.0 - 5.0 ratio  TSH Rfx on Abnormal to Free T4  Result Value Ref Range   TSH 2.030 0.450 - 4.500 uIU/mL  Testosterone  Result Value Ref Range   Testosterone 370 264 - 916 ng/dL  CBC  Result Value Ref Range   WBC 7.2 3.4 - 10.8 x10E3/uL   RBC 4.40 4.14 - 5.80  x10E6/uL   Hemoglobin 13.9 13.0 - 17.7 g/dL   Hematocrit 59.4 62.4 - 51.0 %   MCV 92 79 - 97 fL   MCH 31.6 26.6 - 33.0 pg   MCHC 34.3 31.5 - 35.7 g/dL   RDW 87.7 88.3 - 84.5 %   Platelets 227 150 - 450 x10E3/uL  Vitamin B12  Result Value Ref Range   Vitamin B-12 618 232 - 1,245 pg/mL  VITAMIN D 25 Hydroxy (Vit-D Deficiency, Fractures)  Result Value Ref Range   Vit D, 25-Hydroxy 11.7 (L) 30.0 - 100.0 ng/mL  Apo A1 + B + Ratio  Result Value Ref Range   Apolipoprotein A-1 152 101 - 178 mg/dL   Apolipoprotein B 876 (H) <90 mg/dL   Apolipo. B/A-1 Ratio 0.8 (H) 0.0 - 0.7 ratio    Assessment & Plan    Medicare annual wellness visit, subsequent  Alcohol dependence, daily use (HCC) Assessment & Plan: Noted.  Counseled patient on recommended maximum of 2 standard alcoholic beverages per day and long-term risk for liver damage.  Continue to monitor.   Essential hypertension Assessment & Plan: Chronic, remains elevated. Home readings are borderline (reports 120s/70s). Reports 130/80 at recent pain management appointment. Currently on losartan , hydrochlorothiazide , and carvedilol .  - Increase carvedilol  to 25 mg twice daily. - Continue losartan  and hydrochlorothiazide  unchanged. - Monitor blood pressure at home.  Orders: -     Microalbumin / creatinine urine ratio -     Comprehensive metabolic panel with GFR -     Carvedilol ; Take 1 tablet (25 mg total) by mouth 2 (two) times daily with a meal.  Dispense: 60 tablet; Refill: 3  Mixed hyperlipidemia Assessment & Plan: Hyperlipidemia managed with rosuvastatin . Previous cholesterol levels were high, but current treatment should improve levels. - Continue rosuvastatin . - Order repeat lipid panel and additional cholesterol markers.  Orders: -     Lipid panel -     Apo A1 + B + Ratio  Chronic fatigue -     TSH Rfx on Abnormal to Free T4 -     Testosterone -     CBC -     Vitamin B12 -     VITAMIN D 25 Hydroxy (Vit-D Deficiency,  Fractures)  Pneumococcal vaccination indicated -     Pneumococcal conjugate vaccine 20-valent  Encounter for colorectal cancer screening -     Cologuard  Cervicalgia Assessment & Plan: Noted.  No acute concerns.  Follows with pain management; defer to specialist management.       Medicare Adult Wellness Visit subsequent Annual wellness visit conducted. Discussed alcohol use and recommended reduction to protect liver health.  Patient does not wish to do a colonoscopy at this point; Cologuard test for colon cancer screening ordered.  Patient remains interested in considering colonoscopy at a future time. - Complete Medicare annual wellness visit. - Recommend no more than two alcoholic drinks per day.  Chronic fatigue Noted.  Will evaluate for potential metabolic causes as identified above.  Hearing loss Reported hearing loss with previous audiologist visit confirming need for hearing aids. - Follow up with audiology to obtain hearing aids.    Return in about 8 weeks (around 08/04/2024) for Chronic f/u.      I discussed the assessment and treatment plan with the patient  The patient was provided an opportunity to ask questions and all were answered. The patient agreed with the plan and demonstrated an understanding of the instructions.   The patient was advised to call back or seek an in-person evaluation if the symptoms worsen or if the condition fails to improve as anticipated.    LAURAINE LOISE BUOY, DO  [facility] [phone] (phone) [phone] (fax)  [facility]

## 2024-06-08 NOTE — Assessment & Plan Note (Addendum)
 Chronic, remains elevated. Home readings are borderline (reports 120s/70s). Reports 130/80 at recent pain management appointment. Currently on losartan , hydrochlorothiazide , and carvedilol .  - Increase carvedilol  to 25 mg twice daily. - Continue losartan  and hydrochlorothiazide  unchanged. - Monitor blood pressure at home.

## 2024-06-08 NOTE — Assessment & Plan Note (Signed)
 Hyperlipidemia managed with rosuvastatin . Previous cholesterol levels were high, but current treatment should improve levels. - Continue rosuvastatin . - Order repeat lipid panel and additional cholesterol markers.

## 2024-06-08 NOTE — Patient Instructions (Addendum)
 Recommended vaccine to get at the pharmacy; Shingrix (for shingles) and Heplisav-B (hepatitis B).

## 2024-06-09 LAB — LIPID PANEL
Chol/HDL Ratio: 20.3 ratio — ABNORMAL HIGH (ref 0.0–5.0)
Cholesterol, Total: 406 mg/dL — ABNORMAL HIGH (ref 100–199)
HDL: 20 mg/dL — ABNORMAL LOW (ref >39)
Triglycerides: 2443 mg/dL (ref 0–149)

## 2024-06-09 LAB — CBC
Hematocrit: 40.5 % (ref 37.5–51.0)
Hemoglobin: 13.9 g/dL (ref 13.0–17.7)
MCH: 31.6 pg (ref 26.6–33.0)
MCHC: 34.3 g/dL (ref 31.5–35.7)
MCV: 92 fL (ref 79–97)
Platelets: 227 x10E3/uL (ref 150–450)
RBC: 4.4 x10E6/uL (ref 4.14–5.80)
RDW: 12.2 % (ref 11.6–15.4)
WBC: 7.2 x10E3/uL (ref 3.4–10.8)

## 2024-06-09 LAB — COMPREHENSIVE METABOLIC PANEL WITH GFR
ALT: 60 IU/L — ABNORMAL HIGH (ref 0–44)
AST: 83 IU/L — ABNORMAL HIGH (ref 0–40)
Albumin: 4 g/dL (ref 3.8–4.9)
Alkaline Phosphatase: 109 IU/L (ref 47–123)
BUN/Creatinine Ratio: 9 (ref 9–20)
BUN: 11 mg/dL (ref 6–24)
Bilirubin Total: 0.4 mg/dL (ref 0.0–1.2)
CO2: 21 mmol/L (ref 20–29)
Calcium: 9.1 mg/dL (ref 8.7–10.2)
Chloride: 95 mmol/L — ABNORMAL LOW (ref 96–106)
Creatinine, Ser: 1.16 mg/dL (ref 0.76–1.27)
Globulin, Total: 2.9 g/dL (ref 1.5–4.5)
Glucose: 106 mg/dL — ABNORMAL HIGH (ref 70–99)
Potassium: 3.8 mmol/L (ref 3.5–5.2)
Sodium: 133 mmol/L — ABNORMAL LOW (ref 134–144)
Total Protein: 6.9 g/dL (ref 6.0–8.5)
eGFR: 76 mL/min/1.73 (ref >59)

## 2024-06-09 LAB — MICROALBUMIN / CREATININE URINE RATIO
Creatinine, Urine: 55.3 mg/dL
Microalb/Creat Ratio: 88 mg/g{creat} — ABNORMAL HIGH (ref 0–29)
Microalbumin, Urine: 48.7 ug/mL

## 2024-06-09 LAB — TESTOSTERONE: Testosterone: 370 ng/dL (ref 264–916)

## 2024-06-09 LAB — APO A1 + B + RATIO
Apolipo. B/A-1 Ratio: 0.8 ratio — ABNORMAL HIGH (ref 0.0–0.7)
Apolipoprotein A-1: 152 mg/dL (ref 101–178)
Apolipoprotein B: 123 mg/dL — ABNORMAL HIGH (ref <90)

## 2024-06-09 LAB — VITAMIN B12: Vitamin B-12: 618 pg/mL (ref 232–1245)

## 2024-06-09 LAB — VITAMIN D 25 HYDROXY (VIT D DEFICIENCY, FRACTURES): Vit D, 25-Hydroxy: 11.7 ng/mL — ABNORMAL LOW (ref 30.0–100.0)

## 2024-06-09 LAB — TSH RFX ON ABNORMAL TO FREE T4: TSH: 2.03 u[IU]/mL (ref 0.450–4.500)

## 2024-06-11 ENCOUNTER — Ambulatory Visit: Payer: Self-pay | Admitting: Family Medicine

## 2024-06-11 DIAGNOSIS — E7849 Other hyperlipidemia: Secondary | ICD-10-CM

## 2024-06-11 DIAGNOSIS — E781 Pure hyperglyceridemia: Secondary | ICD-10-CM

## 2024-06-11 DIAGNOSIS — E559 Vitamin D deficiency, unspecified: Secondary | ICD-10-CM

## 2024-06-11 MED ORDER — GEMFIBROZIL 600 MG PO TABS: 600.0000 mg | ORAL_TABLET | Freq: Two times a day (BID) | ORAL | 3 refills | Status: DC

## 2024-06-11 MED ORDER — VITAMIN D (ERGOCALCIFEROL) 1.25 MG (50000 UNIT) PO CAPS: 50000.0000 [IU] | ORAL_CAPSULE | ORAL | 1 refills | Status: AC

## 2024-06-11 NOTE — Progress Notes (Signed)
 Called patient to confirm that he did see provider's message, VM is full so could not leave message.  Will try to call again at a later time.

## 2024-06-15 NOTE — Progress Notes (Signed)
 Last read by Oneil KATHEE Buck at 5:58AM on 06/11/2024.

## 2024-06-18 NOTE — Progress Notes (Signed)
 Javier Griffin                                          MRN: [id number]   06/18/2024   The [facility] Quality Team Specialist reviewed this patient medical record for the purposes of chart review for care gap closure. The following were reviewed: erroneous encounter.    [facility] Quality Team

## 2024-06-29 ENCOUNTER — Ambulatory Visit: Attending: Cardiovascular Disease | Admitting: Cardiovascular Disease

## 2024-06-29 ENCOUNTER — Encounter: Payer: Self-pay | Admitting: Cardiovascular Disease

## 2024-06-29 VITALS — BP 160/100 | HR 59 | Ht 72.0 in | Wt 221.1 lb

## 2024-06-29 DIAGNOSIS — E781 Pure hyperglyceridemia: Secondary | ICD-10-CM

## 2024-06-29 DIAGNOSIS — I10 Essential (primary) hypertension: Secondary | ICD-10-CM

## 2024-06-29 DIAGNOSIS — E78019 Familial hypercholesterolemia, unspecified: Secondary | ICD-10-CM | POA: Diagnosis not present

## 2024-06-29 MED ORDER — DOXAZOSIN MESYLATE 2 MG PO TABS: 2.0000 mg | ORAL_TABLET | Freq: Two times a day (BID) | ORAL | 3 refills | Status: AC | PRN

## 2024-06-29 MED ORDER — REPATHA SURECLICK 140 MG/ML ~~LOC~~ SOAJ: 140.0000 mg | SUBCUTANEOUS | 3 refills | Status: AC

## 2024-06-29 NOTE — Progress Notes (Signed)
 A-fib cardiology Office Note  Date:  06/29/2024   ID:  TOBI Griffin, DOB 02-Dec-1970, MRN [id number]  PCP:  Donzella Lauraine SAILOR, DO   Chief Complaint  Patient presents with   New Patient (Initial Visit)    Ref by [name], DO for familial hyperlipidemia/hypertriglyceridemia. Patient denies chest pain or shortness of breath.     HPI:  Javier Griffin is a 53 y.o. male with past medical history of: Past Medical History:  Diagnosis Date   Arthritis    Bronchitis    GERD (gastroesophageal reflux disease)    Headache    due to neck pain   Hypertension    treated at one time, no medications at this time   Neck injury   Who presents by referral from Dr. Donzella for consultation of his high cholesterol and essential hypertension  Reports feeling well Tolerating carvedilol , HCTZ, losartan  Concerned that his blood pressure medications may be contributing to high triglycerides, history of pancreatitis Reports in the past when he stopped his blood pressure medications, his triglycerides came down  Currently taking the 3 medications as detailed above  blood pressure at home: 123/74-150/94 Blood pressure elevated on today's visit even on recheck by myself 160/100 Took his medications this morning  Chronic neck pain, receives injections  Lab work reviewed Total cholesterol 406 LDL greater than 200, unable to measure Lipoprotein a less than 9 Tolerating Crestor  10 daily, starting gemfibrozil  but misses the evening dose  EKG personally reviewed by myself on todays visit EKG Interpretation Date/Time:  Tuesday June 29 2024 09:21:03 EST Ventricular Rate:  59 PR Interval:  130 QRS Duration:  84 QT Interval:  430 QTC Calculation: 425 R Axis:   -8  Text Interpretation: Sinus bradycardia Minimal voltage criteria for LVH, may be normal variant ( R in aVL ) When compared with ECG of 26-Jun-2016 10:51, No significant change was found Confirmed by Perla Lye [phone]) on 06/29/2024  9:58:15 AM    PMH:   has a past medical history of Arthritis, Bronchitis, GERD (gastroesophageal reflux disease), Headache, Hypertension, and Neck injury.   PSH:    Past Surgical History:  Procedure Laterality Date   ANTERIOR CERVICAL DECOMP/DISCECTOMY FUSION N/A 04/22/2013   Procedure: ANTERIOR CERVICAL DECOMPRESSION/DISCECTOMY FUSION 1 CERVICAL SEVEN-THORACIC ONELEVEL/HARDWARE REMOVAL;  Surgeon: Reyes JONETTA Budge, MD;  Location: [facility];  Service: Neurosurgery;  Laterality: N/A;  C7/T1 anterior cervical decompression with fusion interbody prothesis plating and bonegraft with removal of codman slim loc plate   ANTERIOR CERVICAL DECOMP/DISCECTOMY FUSION N/A 07/04/2016   Procedure: ANTERIOR CERVICAL DECOMPRESSION/DISCECTOMY FUSION, INTERBODY PROSTHESIS,PLATE Cervical three-four;  Surgeon: Reyes Budge, MD;  Location: [facility];  Service: Neurosurgery;  Laterality: N/A;   carpel tunnel Left    and elbow    CERVICAL SPINE SURGERY     x3   ELBOW SURGERY Left    nerve release   POSTERIOR CERVICAL FUSION/FORAMINOTOMY N/A 05/26/2014   Procedure: Cervical four-five, Cervical seven-Thoracic one Posterior Cervical Instrumentation and Fusion;  Surgeon: Reyes Budge, MD;  Location: [facility];  Service: Neurosurgery;  Laterality: N/A;   POSTERIOR CERVICAL LAMINECTOMY N/A 02/08/2015   Procedure: Removal of Thoracic one Pedicle Screw;  Surgeon: Reyes Budge, MD;  Location: [facility];  Service: Neurosurgery;  Laterality: N/A;  removal of T1 pedicle screw   WISDOM TOOTH EXTRACTION      Current Outpatient Medications  Medication Sig Dispense Refill   baclofen (LIORESAL) 10 MG tablet Take 5-10 mg by mouth 3 (three) times  daily as needed.     carvedilol  (COREG ) 25 MG tablet Take 1 tablet (25 mg total) by mouth 2 (two) times daily with a meal. 60 tablet 3   doxazosin (CARDURA) 2 MG tablet Take 1 tablet (2 mg total) by mouth 2 (two) times daily as needed. 180 tablet 3   Evolocumab (REPATHA  SURECLICK) 140 MG/ML SOAJ Inject 140 mg into the skin every 14 (fourteen) days. 6 mL 3   fluticasone  (FLONASE ) 50 MCG/ACT nasal spray SPRAY 2 SPRAYS INTO EACH NOSTRIL EVERY DAY 48 mL 1   gemfibrozil  (LOPID ) 600 MG tablet Take 1 tablet (600 mg total) by mouth 2 (two) times daily before a meal. 60 tablet 3   hydrochlorothiazide  (HYDRODIURIL ) 25 MG tablet Take 1 tablet (25 mg total) by mouth daily. 90 tablet 2   losartan  (COZAAR ) 100 MG tablet Take 1 tablet (100 mg total) by mouth daily. 90 tablet 2   pregabalin  (LYRICA ) 150 MG capsule Take 150 mg by mouth every 12 (twelve) hours.     rosuvastatin  (CRESTOR ) 10 MG tablet Take 1 tablet (10 mg total) by mouth daily. 90 tablet 3   Vitamin D, Ergocalciferol, (DRISDOL) 1.25 MG (50000 UNIT) CAPS capsule Take 1 capsule (50,000 Units total) by mouth every 7 (seven) days. 12 capsule 1   No current facility-administered medications for this visit.    Allergies:   Amlodipine  and Lisinopril    Social History:  The patient  reports that he has never smoked. He quit smokeless tobacco use about 32 years ago.  His smokeless tobacco use included snuff. He reports current alcohol use of about 8.0 standard drinks of alcohol per week. He reports that he does not use drugs.   Family History:   family history includes Emphysema in his maternal grandfather; Heart disease in his paternal grandfather; Hypertension in his father; Lung cancer in his mother.    Review of Systems: Review of Systems  Constitutional: Negative.   HENT: Negative.    Respiratory: Negative.    Cardiovascular: Negative.   Gastrointestinal: Negative.   Musculoskeletal: Negative.   Neurological: Negative.   Psychiatric/Behavioral: Negative.    All other systems reviewed and are negative.   PHYSICAL EXAM: VS:  BP (!) 160/100 (BP Location: Right Arm, Patient Position: Sitting, Cuff Size: Normal)   Pulse (!) 59   Ht 6' (1.829 m)   Wt 221 lb 2 oz (100.3 kg)   SpO2 98%   BMI 29.99 kg/m  ,  BMI Body mass index is 29.99 kg/m. GEN: Well nourished, well developed, in no acute distress HEENT: normal Neck: no JVD, carotid bruits, or masses Cardiac: RRR; no murmurs, rubs, or gallops,no edema  Respiratory:  clear to auscultation bilaterally, normal work of breathing GI: soft, nontender, nondistended, + BS MS: no deformity or atrophy Skin: warm and dry, no rash Neuro:  Strength and sensation are intact Psych: euthymic mood, full affect  Recent Labs: 06/08/2024: ALT 60; BUN 11; Creatinine, Ser 1.16; Hemoglobin 13.9; Platelets 227; Potassium 3.8; Sodium 133; TSH 2.030    Lipid Panel Lab Results  Component Value Date   CHOL 406 (H) 06/08/2024   HDL 20 (L) 06/08/2024   LDLCALC Comment (A) 06/08/2024   TRIG 2,443 (HH) 06/08/2024      Wt Readings from Last 3 Encounters:  06/29/24 221 lb 2 oz (100.3 kg)  06/08/24 213 lb 8 oz (96.8 kg)  03/03/24 217 lb 1.6 oz (98.5 kg)     ASSESSMENT AND PLAN:  Problem List Items Addressed  This Visit       Cardiology Problems   Essential hypertension - Primary   Relevant Medications   Evolocumab (REPATHA SURECLICK) 140 MG/ML SOAJ   doxazosin (CARDURA) 2 MG tablet   Other Relevant Orders   EKG 12-Lead (Completed)   Other Visit Diagnoses       Familial hypercholesterolemia, unspecified type       Relevant Medications   Evolocumab (REPATHA SURECLICK) 140 MG/ML SOAJ   doxazosin (CARDURA) 2 MG tablet   Other Relevant Orders   Lipid panel      Essential hypertension Recommend he stay on carvedilol  HCTZ losartan  Reports blood pressure ranging from 120 up to 150 systolic at home, numbers were higher today - Leg swelling in the past on amlodipine  - Suggested he try Cardura 2 mg twice daily as needed for pressure over 150 - If tolerated and if pressure continues to stay high, perhaps he can take this on a regular basis  Hyperlipidemia On Crestor  10, starting gemfibrozil  -Recommend he start Repatha 140 subcu every 2 weeks -  Follow-up if numbers are high could increase Crestor , start Zetia - No improvement in numbers, may need to refer to hyperlipidemia clinic in [facility]  Seen in consultation for Dr. Lauraine Buoy  We can assist with lipid and blood pressure management  Signed, Velinda Lunger, M.D., Ph.D. [facility] [place], [place] [phone]

## 2024-06-29 NOTE — Patient Instructions (Addendum)
 Medication Instructions:   Please start repatha 140 sq every two weeks Stay on crestor  and start gemfibozil  For pressure >150, Take cardura/doxazosin 2 mg twice a day as needed  If you need a refill on your cardiac medications before your next appointment, please call your pharmacy.   Lab work: Your provider would like for you to return in 3-6 months to have the following labs drawn: Lipid Panel.   Please go to [facility] [address] [address], [place] [address] You do not need an appointment.  They are open from 8 am- 4:30 pm.  Lunch from 1:00 pm- 2:00 pm You will need to be fasting.   Testing/Procedures: No new testing needed  Follow-Up: At [facility], you and your health needs are our priority.  As part of our continuing mission to provide you with exceptional heart care, we have created designated Provider Care Teams.  These Care Teams include your primary Cardiologist (physician) and Advanced Practice Providers (APPs -  Physician Assistants and Nurse Practitioners) who all work together to provide you with the care you need, when you need it.  You will need a follow up appointment in 12 months  Providers on your designated Care Team:   Lonni Meager, NP Bernardino Bring, PA-C [place], [place]  COVID-19 Vaccine Information can be found at: [email or web address] For questions related to vaccine distribution or appointments, please email [email or web address] or call [phone].

## 2024-08-03 ENCOUNTER — Ambulatory Visit: Admitting: Family Medicine

## 2024-09-04 ENCOUNTER — Other Ambulatory Visit: Payer: Self-pay | Admitting: Family Medicine

## 2024-09-04 DIAGNOSIS — I10 Essential (primary) hypertension: Secondary | ICD-10-CM

## 2024-09-11 ENCOUNTER — Other Ambulatory Visit: Payer: Self-pay | Admitting: Family Medicine

## 2024-09-11 DIAGNOSIS — E781 Pure hyperglyceridemia: Secondary | ICD-10-CM

## 2024-09-13 NOTE — Telephone Encounter (Signed)
 Requested Prescriptions  Pending Prescriptions Disp Refills   gemfibrozil  (LOPID ) 600 MG tablet [Pharmacy Med Name: GEMFIBROZIL  600 MG TABLET] 180 tablet 0    Sig: TAKE 1 TABLET (600 MG TOTAL) BY MOUTH 2 (TWO) TIMES DAILY BEFORE A MEAL.     Cardiovascular:  Antilipid - Fibric Acid Derivatives Failed - 09/13/2024 11:25 AM      Failed - ALT in normal range and within 360 days    ALT  Date Value Ref Range Status  06/08/2024 60 (H) 0 - 44 IU/L Final         Failed - AST in normal range and within 360 days    AST  Date Value Ref Range Status  06/08/2024 83 (H) 0 - 40 IU/L Final         Failed - Lipid Panel in normal range within the last 12 months    Cholesterol, Total  Date Value Ref Range Status  06/08/2024 406 (H) 100 - 199 mg/dL Final   LDL Chol Calc (NIH)  Date Value Ref Range Status  06/08/2024 Comment (A) 0 - 99 mg/dL Final    Comment:    Triglyceride result indicated is too high for an accurate LDL cholesterol estimation.    LDL Direct  Date Value Ref Range Status  04/26/2015 115 (H) 0 - 99 mg/dL Final   HDL  Date Value Ref Range Status  06/08/2024 20 (L) >39 mg/dL Final   Triglycerides  Date Value Ref Range Status  06/08/2024 2,443 (HH) 0 - 149 mg/dL Final    Comment:    Results confirmed on dilution.          Passed - Cr in normal range and within 360 days    Creatinine, Ser  Date Value Ref Range Status  06/08/2024 1.16 0.76 - 1.27 mg/dL Final         Passed - HGB in normal range and within 360 days    Hemoglobin  Date Value Ref Range Status  06/08/2024 13.9 13.0 - 17.7 g/dL Final         Passed - HCT in normal range and within 360 days    Hematocrit  Date Value Ref Range Status  06/08/2024 40.5 37.5 - 51.0 % Final         Passed - PLT in normal range and within 360 days    Platelets  Date Value Ref Range Status  06/08/2024 227 150 - 450 x10E3/uL Final         Passed - WBC in normal range and within 360 days    WBC  Date Value Ref Range  Status  06/08/2024 7.2 3.4 - 10.8 x10E3/uL Final  06/26/2016 4.8 4.0 - 10.5 K/uL Final         Passed - eGFR is 30 or above and within 360 days    GFR calc Af Amer  Date Value Ref Range Status  06/26/2016 >60 >60 mL/min Final    Comment:    (NOTE) The eGFR has been calculated using the CKD EPI equation. This calculation has not been validated in all clinical situations. eGFR's persistently <60 mL/min signify possible Chronic Kidney Disease.    GFR calc non Af Amer  Date Value Ref Range Status  06/26/2016 >60 >60 mL/min Final   eGFR  Date Value Ref Range Status  06/08/2024 76 >59 mL/min/1.73 Final         Passed - Valid encounter within last 12 months    Recent Outpatient Visits  3 months ago Medicare annual wellness visit, subsequent   [facility] [place], Lauraine SAILOR, DO   6 months ago Annual physical exam   [facility] [place], Lauraine SAILOR, DO   8 months ago Essential hypertension   [facility] [place], Lauraine SAILOR, DO   9 months ago Mixed hyperlipidemia   [facility] [place] [facility] [place], Lauraine SAILOR, DO   9 months ago Asymptomatic hypertensive urgency   [facility] Donzella Lauraine SAILOR, [place]
